# Patient Record
Sex: Female | Born: 1965 | Race: White | Hispanic: No | Marital: Married | Smoking: Current every day smoker
Health system: Southern US, Community
[De-identification: ages and names within clinical notes are randomized; demographics above are authoritative.]

## PROBLEM LIST (undated history)

## (undated) DIAGNOSIS — E785 Hyperlipidemia, unspecified: Secondary | ICD-10-CM

## (undated) DIAGNOSIS — I10 Essential (primary) hypertension: Secondary | ICD-10-CM

## (undated) HISTORY — PX: COLONOSCOPY: SHX174

## (undated) HISTORY — PX: BREAST EXCISIONAL BIOPSY: SUR124

## (undated) HISTORY — PX: HEMORRHOID SURGERY: SHX153

## (undated) HISTORY — DX: Hyperlipidemia, unspecified: E78.5

## (undated) HISTORY — PX: BREAST SURGERY: SHX581

## (undated) HISTORY — DX: Essential (primary) hypertension: I10

---

## 1991-10-18 HISTORY — PX: TUBAL LIGATION: SHX77

## 2000-01-27 ENCOUNTER — Other Ambulatory Visit: Admission: RE | Admit: 2000-01-27 | Discharge: 2000-01-27 | Payer: Self-pay | Admitting: Obstetrics & Gynecology

## 2001-01-11 ENCOUNTER — Other Ambulatory Visit: Admission: RE | Admit: 2001-01-11 | Discharge: 2001-01-11 | Payer: Self-pay | Admitting: Obstetrics & Gynecology

## 2001-10-15 ENCOUNTER — Ambulatory Visit (HOSPITAL_COMMUNITY): Admission: RE | Admit: 2001-10-15 | Discharge: 2001-10-15 | Payer: Self-pay | Admitting: Surgery

## 2001-10-15 ENCOUNTER — Encounter (INDEPENDENT_AMBULATORY_CARE_PROVIDER_SITE_OTHER): Payer: Self-pay

## 2002-03-19 ENCOUNTER — Ambulatory Visit (HOSPITAL_BASED_OUTPATIENT_CLINIC_OR_DEPARTMENT_OTHER): Admission: RE | Admit: 2002-03-19 | Discharge: 2002-03-19 | Payer: Self-pay | Admitting: Surgery

## 2002-03-19 ENCOUNTER — Encounter (INDEPENDENT_AMBULATORY_CARE_PROVIDER_SITE_OTHER): Payer: Self-pay | Admitting: Specialist

## 2002-04-11 ENCOUNTER — Encounter: Payer: Self-pay | Admitting: Surgery

## 2002-04-11 ENCOUNTER — Encounter: Admission: RE | Admit: 2002-04-11 | Discharge: 2002-04-11 | Payer: Self-pay | Admitting: Surgery

## 2004-06-14 ENCOUNTER — Ambulatory Visit (HOSPITAL_COMMUNITY): Admission: RE | Admit: 2004-06-14 | Discharge: 2004-06-14 | Payer: Self-pay | Admitting: Surgery

## 2004-10-25 ENCOUNTER — Emergency Department (HOSPITAL_COMMUNITY): Admission: EM | Admit: 2004-10-25 | Discharge: 2004-10-25 | Payer: Self-pay | Admitting: Family Medicine

## 2005-06-15 ENCOUNTER — Other Ambulatory Visit: Admission: RE | Admit: 2005-06-15 | Discharge: 2005-06-15 | Payer: Self-pay | Admitting: Obstetrics & Gynecology

## 2008-02-28 ENCOUNTER — Ambulatory Visit (HOSPITAL_COMMUNITY): Admission: RE | Admit: 2008-02-28 | Discharge: 2008-02-28 | Payer: Self-pay | Admitting: Obstetrics & Gynecology

## 2009-04-01 ENCOUNTER — Ambulatory Visit (HOSPITAL_COMMUNITY): Admission: RE | Admit: 2009-04-01 | Discharge: 2009-04-01 | Payer: Self-pay | Admitting: Obstetrics & Gynecology

## 2009-06-09 ENCOUNTER — Encounter: Admission: RE | Admit: 2009-06-09 | Discharge: 2009-06-09 | Payer: Self-pay | Admitting: Obstetrics & Gynecology

## 2010-11-01 ENCOUNTER — Encounter (INDEPENDENT_AMBULATORY_CARE_PROVIDER_SITE_OTHER): Payer: Self-pay | Admitting: *Deleted

## 2010-11-18 NOTE — Letter (Signed)
Summary: Pre Visit Letter Revised  [facility]  [address] [place], [place] [address]   Phone: [phone]  Fax: [phone]        11/01/2010 MRN: [id number] Tiffany Hensley [address] [place], [place]  [address]             Procedure Date:  December 06, 2010   dir col- Dr Jarold Motto   Welcome to the Gastroenterology Division at [facility].    You are scheduled to see a nurse for your pre-procedure visit on November 22, 2010 at 8:30am on the 3rd floor at [facility], [address]  We ask that you try to arrive at our office 15 minutes prior to your appointment time to allow for check-in.  Please take a minute to review the attached form.  If you answer "Yes" to one or more of the questions on the first page, we ask that you call the person listed at your earliest opportunity.  If you answer "No" to all of the questions, please complete the rest of the form and bring it to your appointment.    Your nurse visit will consist of discussing your medical and surgical history, your immediate family medical history, and your medications.   If you are unable to list all of your medications on the form, please bring the medication bottles to your appointment and we will list them.  We will need to be aware of both prescribed and over the counter drugs.  We will need to know exact dosage information as well.    Please be prepared to read and sign documents such as consent forms, a financial agreement, and acknowledgement forms.  If necessary, and with your consent, a friend or relative is welcome to sit-in on the nurse visit with you.  Please bring your insurance card so that we may make a copy of it.  If your insurance requires a referral to see a specialist, please bring your referral form from your primary care physician.  No co-pay is required for this nurse visit.     If you cannot keep your appointment, please call [phone] to cancel or reschedule  prior to your appointment date.  This allows [place] the opportunity to schedule an appointment for another patient in need of care.    Thank you for choosing [facility] for your medical needs.  We appreciate the opportunity to care for you.  Please visit [place] at our website  to learn more about our practice.  Sincerely, The Gastroenterology Division

## 2010-11-19 ENCOUNTER — Encounter (INDEPENDENT_AMBULATORY_CARE_PROVIDER_SITE_OTHER): Payer: Self-pay | Admitting: *Deleted

## 2010-11-22 ENCOUNTER — Encounter: Payer: Self-pay | Admitting: Gastroenterology

## 2010-12-02 NOTE — Letter (Signed)
Summary: [facility] Instructions  [facility]  [address] [place], [place] [address]   Phone: [phone]  Fax: [phone]       Tiffany Hensley    July 08, 1966    MRN: [id number]        Procedure Day Dorna Bloom:  Duanne Limerick  12/06/10     Arrival Time:  9:00AM     Procedure Time:  10:00AM     Location of Procedure:                    _ X_  [facility] (4th Floor)                      PREPARATION FOR COLONOSCOPY WITH MOVIPREP   Starting 5 days prior to your procedure 12/01/10 do not eat nuts, seeds, popcorn, corn, beans, peas,  salads, or any raw vegetables.  Do not take any fiber supplements (e.g. Metamucil, Citrucel, and Benefiber).  THE DAY BEFORE YOUR PROCEDURE         DATE: 12/05/10  DAY: SUNDAY  1.  Drink clear liquids the entire day-NO SOLID FOOD  2.  Do not drink anything colored red or purple.  Avoid juices with pulp.  No orange juice.  3.  Drink at least 64 oz. (8 glasses) of fluid/clear liquids during the day to prevent dehydration and help the prep work efficiently.  CLEAR LIQUIDS INCLUDE: Water Jello Ice Popsicles Tea (sugar ok, no milk/cream) Powdered fruit flavored drinks Coffee (sugar ok, no milk/cream) Gatorade Juice: apple, white grape, white cranberry  Lemonade Clear bullion, consomm, broth Carbonated beverages (any kind) Strained chicken noodle soup Hard Candy                             4.  In the morning, mix first dose of MoviPrep solution:    Empty 1 Pouch A and 1 Pouch B into the disposable container    Add lukewarm drinking water to the top line of the container. Mix to dissolve    Refrigerate (mixed solution should be used within 24 hrs)  5.  Begin drinking the prep at 5:00 p.m. The MoviPrep container is divided by 4 marks.   Every 15 minutes drink the solution down to the next mark (approximately 8 oz) until the full liter is complete.   6.  Follow completed prep with 16 oz of clear liquid of your choice (Nothing  red or purple).  Continue to drink clear liquids until bedtime.  7.  Before going to bed, mix second dose of MoviPrep solution:    Empty 1 Pouch A and 1 Pouch B into the disposable container    Add lukewarm drinking water to the top line of the container. Mix to dissolve    Refrigerate  THE DAY OF YOUR PROCEDURE      DATE: 12/06/10  DAY: MONDAY  Beginning at 5:00AM (5 hours before procedure):         1. Every 15 minutes, drink the solution down to the next mark (approx 8 oz) until the full liter is complete.  2. Follow completed prep with 16 oz. of clear liquid of your choice.    3. You may drink clear liquids until 8:00AM (2 HOURS BEFORE PROCEDURE).   MEDICATION INSTRUCTIONS  Unless otherwise instructed, you should take regular prescription medications with a small sip of water   as early as possible the morning of your  procedure.        OTHER INSTRUCTIONS  You will need a responsible adult at least 47 years of age to accompany you and drive you home.   This person must remain in the waiting room during your procedure.  Wear loose fitting clothing that is easily removed.  Leave jewelry and other valuables at home.  However, you may wish to bring a book to read or  an iPod/MP3 player to listen to music as you wait for your procedure to start.  Remove all body piercing jewelry and leave at home.  Total time from sign-in until discharge is approximately 2-3 hours.  You should go home directly after your procedure and rest.  You can resume normal activities the  day after your procedure.  The day of your procedure you should not:   Drive   Make legal decisions   Operate machinery   Drink alcohol   Return to work  You will receive specific instructions about eating, activities and medications before you leave.    The above instructions have been reviewed and explained to me by   Wyona Almas RN  November 22, 2010 8:57 AM     I fully understand and can  verbalize these instructions _____________________________ Date _________

## 2010-12-02 NOTE — Miscellaneous (Signed)
Summary: [facility] PREVISIT/PREP  Clinical Lists Changes  Medications: Added new medication of MOVIPREP 100 GM  SOLR (PEG-KCL-NACL-NASULF-NA ASC-C) As per prep instructions. - Signed Rx of MOVIPREP 100 GM  SOLR (PEG-KCL-NACL-NASULF-NA ASC-C) As per prep instructions.;  #1 x 0;  Signed;  Entered by: Wyona Almas RN;  Authorized by: Mardella Layman MD [facility];  Method used: Electronically to [facility] [address], [place], [place], [place]  [address], Ph: [phone] or [phone], Fax: [phone] Observations: Added new observation of NKA: T (11/22/2010 8:32)    Prescriptions: MOVIPREP 100 GM  SOLR (PEG-KCL-NACL-NASULF-NA ASC-C) As per prep instructions.  #1 x 0   Entered by:   Wyona Almas RN   Authorized by:   Mardella Layman MD [facility]   Signed by:   Wyona Almas RN on 11/22/2010   Method used:   Electronically to        [facility] [address] [phone]* (retail)       [address]       [place], [place]  [address]       Ph: [phone] or [phone]       Fax: [phone]   RxID:   [id number]

## 2010-12-06 ENCOUNTER — Other Ambulatory Visit (AMBULATORY_SURGERY_CENTER): Payer: BC Managed Care – PPO | Admitting: Gastroenterology

## 2010-12-06 ENCOUNTER — Encounter: Payer: Self-pay | Admitting: Gastroenterology

## 2010-12-06 DIAGNOSIS — K5730 Diverticulosis of large intestine without perforation or abscess without bleeding: Secondary | ICD-10-CM

## 2010-12-06 DIAGNOSIS — Z800 Family history of malignant neoplasm of digestive organs: Secondary | ICD-10-CM

## 2010-12-06 DIAGNOSIS — Z1211 Encounter for screening for malignant neoplasm of colon: Secondary | ICD-10-CM

## 2010-12-14 NOTE — Procedures (Signed)
Summary: Colonoscopy  Patient: Tiffany Hensley Note: All result statuses are Final unless otherwise noted.  Tests: (1) Colonoscopy (COL)   COL Colonoscopy           DONE     [facility]     [address]     [place], [place]  [address]           COLONOSCOPY PROCEDURE REPORT           PATIENT:  Tiffany Hensley, Tiffany Hensley  MR#:  [id number]     BIRTHDATE:  07/28/1966, 44 yrs. old  GENDER:  female     ENDOSCOPIST:  Vania Rea. Jarold Motto, MD, [facility]     REF. BY:     PROCEDURE DATE:  12/06/2010     PROCEDURE:  Higher-risk screening colonoscopy G0105           ASA CLASS:  Class I     INDICATIONS:  family history of colon cancer     MEDICATIONS:   Fentanyl 100 mcg IV, Versed 10 mg IV, Benadryl 12.5     mg IV           DESCRIPTION OF PROCEDURE:   After the risks benefits and     alternatives of the procedure were thoroughly explained, informed     consent was obtained.  Digital rectal exam was performed and     revealed no abnormalities.   The Pentax Ped Colon [id number]     endoscope was introduced through the anus and advanced to the     cecum, which was identified by both the appendix and ileocecal     valve, without limitations.  The quality of the prep was     excellent, using MoviPrep.  The instrument was then slowly     withdrawn as the colon was fully examined.     <<PROCEDUREIMAGES>>           FINDINGS:  Mild diverticulosis was found in the sigmoid to     descending colon segments.   Retroflexed views in the rectum     revealed no abnormalities.    The scope was then withdrawn from     the patient and the procedure completed.           COMPLICATIONS:  None     ENDOSCOPIC IMPRESSION:     1) Mild diverticulosis in the sigmoid to descending colon     segments     2) No polyps or cancers     RECOMMENDATIONS:     1) high fiber diet     2) Given your significant family history of colon cancer, you     should have a repeat colonoscopy in 5 years     REPEAT EXAM:  No        ______________________________     Vania Rea. Jarold Motto, MD, [facility]           CC:           n.     eSIGNED:   Vania Rea. Kijuana Ruppel at 12/06/2010 10:18 AM           Rust, Annabelle Harman, [id number]  Note: An exclamation mark (!) indicates a result that was not dispersed into the flowsheet. Document Creation Date: 12/06/2010 10:18 AM _______________________________________________________________________  (1) Order result status: Final Collection or observation date-time: 12/06/2010 10:11 Requested date-time:  Receipt date-time:  Reported date-time:  Referring Physician:   Ordering Physician: Sheryn Bison [phone]) Specimen Source:  Source: Launa Grill Order Number: [id number] Lab  site:   Appended Document: Colonoscopy    Clinical Lists Changes  Observations: Added new observation of COLONNXTDUE: 11/2015 (12/06/2010 11:04)

## 2011-03-04 NOTE — Op Note (Signed)
[place]  Patient:    Tiffany Hensley, Tiffany Hensley Visit Number: [id number] MRN: [id number]          Service Type: DSU Location: [facility] Attending Physician:  Katha Cabal Dictated by:   Thornton Park Daphine Deutscher, M.D. Proc. Date: 03/19/02 Admit Date:  03/19/2002                             Operative Report  CCS# [id number]  PREOPERATIVE DIAGNOSIS:  Increasing mass, right cheek, recurrent drainage from right breast.  POSTOPERATIVE DIAGNOSIS:  Right sebaceous cyst, status post excision and excision of granulation tissue, right breath.  OPERATION PERFORMED:  SURGEON:  Matthew B. Daphine Deutscher, M.D.  ANESTHESIA:  MAC.  DESCRIPTION OF PROCEDURE:  The patient is a 45 year old female who is brought to operating room 3 after marking these two little lesions.  An ellipse was described over this punctum and a fairly prominent mass on the right cheek. This area was infiltrated with 1% lidocaine plain and the little ellipse was excised and easily producing a prominent sebaceous cyst beneath it.  I dissected this from the surrounding tissue and it came out in toto.  The wound was then closed with 5-0 Vicryl in the depths and subcuticularly.  This coapted the tissue completely and I then buttressed that closure with benzoin and two Steri-Strips.  Next, I went down to the breast where different instruments were used.  I excised an ellipse from the 3 oclock position down to about the 5:30 to 6 oclock position of the areola.  This was an area where she had had this continued intermittent drainage.  I went down and excised basically some granulation tissue.  I did not encounter any pus.  I undermined the areola a bit.  It did not get back into the nipple.  I did not find any pockets.  I excised the ellipse and the underlying tissue in one block.  This was then closed with 4-0 Vicryl subcutaneously and subcuticularly and with benzoin and Steri-Strips on the skin.  The  patient seemed to tolerate the procedure well. She will be given Keflex 500 mg b.i.d. for four or five days and Vicodin to take if needed for pain. Dictated by:   Thornton Park Daphine Deutscher, M.D. Attending Physician:  Katha Cabal DD:  03/20/03 TD:  03/20/02 Job: [id number]

## 2011-03-04 NOTE — Op Note (Signed)
[facility]  Patient:    CLARENCE, DUNSMORE Visit Number: [id number] MRN: [id number]          Service Type: DSU Location: DAY Attending Physician:  Katha Cabal Dictated by:   Thorn[place] Park Daphine Deutscher, M.D. Proc. Date: 10/15/01 Admit Date:  10/15/2001   CC:         Gerrit Friends. Aldona Bar, M.D.   Operative Report  PREOPERATIVE DIAGNOSIS:  Recurrent abscesses right nipple region.  HISTORY:  Woodrow Dulski is a 45 year old female, who has had some recurrent smoldering mastitis medially along the areolar border of the right nipple. She has been on antibiotics for several weeks and still is having persistent drainage.  Informed consent was obtained regarding excision and drainage.  SURGEON:  Thornton Park. Daphine Deutscher, M.D.  ANESTHESIA:  General by LMA.  PROCEDURE:  Right breast biopsy.  DESCRIPTION OF PROCEDURE:  Ms. Buxbaum was taken back to room 1 and given general by LMA.  The breast was prepped with Betadine and draped sterilely.  A little probe was passed into the tract through which just a tiny, pinheads work of purulence had been expressed.  I then created an incision to excise that chronic tract, and I removed about a 1.5 cm in diameter sphere of retroareolar breast tissue.  This was in the area of the previous infection. I did not cut across anything that was grossly draining.  I tried to carry this in medially as well as laterally beneath the areola to get out any potential source for this recurrent infection.  When completed, the bleeding was controlled with the electrocautery.  There did not appear to be any sinuses or fistulae tract remaining.  The wound was irrigated with saline and then with some Marcaine and lidocaine and was approximated with 4-0 Vicryl subcutaneously.  Where I had actually excised a little tract, I left a little notch so that this could subsequently drain if needed.  Sterile dressing was applied.  The patient will be kept  on Keflex for the next two weeks and be followed up in the office. Dictated by:   Thornton Park Daphine Deutscher, M.D. Attending Physician:  Katha Cabal DD:  10/15/01 TD:  10/15/01 Job: [id number]

## 2016-01-20 ENCOUNTER — Encounter: Payer: Self-pay | Admitting: *Deleted

## 2016-02-11 ENCOUNTER — Encounter: Payer: Self-pay | Admitting: Gastroenterology

## 2019-09-20 ENCOUNTER — Telehealth (HOSPITAL_COMMUNITY): Payer: Self-pay | Admitting: *Deleted

## 2019-09-20 ENCOUNTER — Other Ambulatory Visit (HOSPITAL_COMMUNITY): Payer: Self-pay | Admitting: *Deleted

## 2019-09-20 DIAGNOSIS — N644 Mastodynia: Secondary | ICD-10-CM

## 2019-09-20 NOTE — Telephone Encounter (Signed)
Attempted to call patient to get clarification on breast symptoms. No one answered phone. Left voicemail for patient to call me back.

## 2019-09-20 NOTE — Telephone Encounter (Signed)
Patient returned my phone call and stated the breast surgery was around 30 years ago. Patient complained of left breast pain, swelling, and redness. Patient is scheduled to come to [facility] 10/03/2019 at 1015. Told patient is symptoms get worse or shows signs of infection to call me and we can work her in. Patient verbalized understanding.

## 2019-10-03 ENCOUNTER — Other Ambulatory Visit: Payer: Self-pay

## 2019-10-03 ENCOUNTER — Encounter (HOSPITAL_COMMUNITY): Payer: Self-pay

## 2019-10-03 ENCOUNTER — Ambulatory Visit (HOSPITAL_COMMUNITY)
Admission: RE | Admit: 2019-10-03 | Discharge: 2019-10-03 | Disposition: A | Discharge status: Home / Self Care | Payer: Self-pay | Source: Ambulatory Visit | Attending: Obstetrics and Gynecology | Admitting: Obstetrics and Gynecology

## 2019-10-03 ENCOUNTER — Ambulatory Visit: Payer: Self-pay

## 2019-10-03 ENCOUNTER — Ambulatory Visit
Admission: RE | Admit: 2019-10-03 | Discharge: 2019-10-03 | Disposition: A | Discharge status: Home / Self Care | Payer: No Typology Code available for payment source | Source: Ambulatory Visit | Attending: Obstetrics and Gynecology | Admitting: Obstetrics and Gynecology

## 2019-10-03 DIAGNOSIS — Z01419 Encounter for gynecological examination (general) (routine) without abnormal findings: Secondary | ICD-10-CM | POA: Insufficient documentation

## 2019-10-03 DIAGNOSIS — N644 Mastodynia: Secondary | ICD-10-CM

## 2019-10-03 HISTORY — DX: Mastodynia: N64.4

## 2019-10-03 NOTE — Patient Instructions (Signed)
Explained breast self awareness with Duaine Dredge. Let patient know [facility] will cover Pap smears and HPV typing every 5 years unless has a history of abnormal Pap smears. Referred patient to the [facility] for a screening mammogram. Appointment scheduled for Thursday, October 03, 2019 at 1310. Discussed smoking cessation with patient. Referred to the [facility] Quitline and gave resources to the free smoking cessation classes at [facility]. Patient aware of appointment and will be there. Let patient know will follow up with her within the next couple weeks with results of Pap smear by letter or phone. Newborn verbalized understanding.  Talise Sligh, Arvil Chaco, RN 12:38 PM

## 2019-10-03 NOTE — Progress Notes (Signed)
Complaints of left nipple area pain x 3-4 weeks that comes and goes. Patient rates the pain at a 2 out of 10.  Pap Smear: Pap smear completed today. Last Pap smear was over 5 years ago and normal per patient. Per patient has no history of an abnormal Pap smear. No Pap smear results are in Epic.  Physical exam: Breasts Breasts symmetrical. No skin abnormalities bilateral breasts. No nipple retraction bilateral breasts. No nipple discharge bilateral breasts. No lymphadenopathy. No lumps palpated bilateral breasts. No complaints of pain or tenderness on exam. Referred patient to the [facility] for a screening mammogram. Appointment scheduled for Thursday, October 03, 2019 at 1310.        Pelvic/Bimanual   Ext Genitalia No lesions, no swelling and no discharge observed on external genitalia.         Vagina Vagina pink and normal texture. No lesions or discharge observed in vagina.          Cervix Cervix is present. Cervix pink and of normal texture. No discharge observed.     Uterus Uterus is present and palpable. Uterus in normal position and normal size.        Adnexae Bilateral ovaries present and palpable. No tenderness on palpation.         Rectovaginal No rectal exam completed today since patient had no rectal complaints. No skin abnormalities observed on exam.    Smoking History: Patient is a current smoker. Discussed smoking cessation with patient. Referred to the [facility] Quitline and gave resources to the free smoking cessation classes at [facility].  Patient Navigation: Patient education provided. Access to services provided for patient through [facility] program.   Colorectal Cancer Screening: Per patient had a colonoscopy completed in 2015. No complaints today.   Breast and Cervical Cancer Risk Assessment: Patient has no family history of breast cancer, known genetic mutations, or radiation treatment to the chest before age 24. Patient has no history of cervical  dysplasia, immunocompromised, or DES exposure in-utero.  Risk Assessment    Risk Scores      10/03/2019   Last edited by: Loletta Parish, RN   5-year risk: 1.2 %   Lifetime risk: 8.3 %

## 2019-10-08 LAB — CYTOLOGY - PAP
Comment: NEGATIVE
Diagnosis: UNDETERMINED — AB
High risk HPV: NEGATIVE

## 2019-10-09 ENCOUNTER — Other Ambulatory Visit (HOSPITAL_COMMUNITY): Payer: Self-pay | Admitting: *Deleted

## 2019-10-09 ENCOUNTER — Telehealth (HOSPITAL_COMMUNITY): Payer: Self-pay | Admitting: *Deleted

## 2019-10-09 MED ORDER — METRONIDAZOLE 500 MG PO TABS: 500.0000 mg | ORAL_TABLET | Freq: Two times a day (BID) | ORAL | 0 refills | Status: DC

## 2019-10-09 NOTE — Telephone Encounter (Signed)
Patient returned my phone call. Explained to patient that her Pap smear showed abnormal cells and the HPV was negative. Informed patient that a Pap smear is recommended in one year for follow-up. Patients Pap smear showed Trichomonas. Explained Trichomonas to patient and that her partner would need to get treated. Patient stated she has not been sexually active in years. Told patient that an antibiotic prescription for Flagyl has been sent to her pharmacy. Explained to patient that the prescription will be taken BID x 7 days and to avoid alcohol while taking. Verified patients pharmacy. Patient verbalized understanding.

## 2019-10-09 NOTE — Telephone Encounter (Signed)
Attempted to call patient on both home and mobile number to discuss Pap smear results. No one answered either phone. Left voicemail on both numbers for patient to call me back.

## 2019-11-06 ENCOUNTER — Other Ambulatory Visit: Payer: Self-pay | Admitting: Obstetrics and Gynecology

## 2019-11-06 ENCOUNTER — Other Ambulatory Visit: Payer: Self-pay

## 2019-11-06 ENCOUNTER — Inpatient Hospital Stay: Payer: Self-pay | Attending: Obstetrics and Gynecology | Admitting: *Deleted

## 2019-11-06 VITALS — BP 170/98 | Temp 97.1°F | Ht 62.0 in | Wt 98.0 lb

## 2019-11-06 DIAGNOSIS — Z0000 Encounter for general adult medical examination without abnormal findings: Secondary | ICD-10-CM

## 2019-11-06 NOTE — Progress Notes (Signed)
[facility] initial screening     Clinical Measurement:  Height: 62 in Weight: 98 lb  Blood Pressure: 165/105  Blood Pressure #2: 170/98 Fasting Labs Drawn Today, will review with patient when they result.   Medical History:  Patient states that she does not have a history of  high cholesterol, high blood pressure and diabetes.  Medications:  Patient states that she does not take medication to lower cholesterol, blood pressure and blood sugar. Patient does not take an aspirin a day to help prevent a heart attack or stroke.    Blood pressure, self measurement: Patient states that she does measure blood pressure from home daily. Patient does not share blood pressure readings with a health care provider.   Nutrition: Patient states that on average she eats 0 cups of fruit and 2 cups of vegetables per day. Patient states that she does not eat fish at least 2 times per week. Patient eats less than half servings of whole grains. Patient does not drink less than 36 ounces of beverages with added sugar weekly. Patient is not currently watching sodium or salt intake. In the past 7 days patient has had drinks containing alcohol on 1 day. On an average day that patient consumes alcohol patient will consume 2 drinks containing alcohol.      Physical activity:  Patient states that she gets 0 minutes of moderate and 0 of vigorous physical activity each week.  Smoking status:  Patient states that she is a current tobacco user. Offered to refer patient to the [facility] Quitline or one of the [facility] classes but patient declined at this time.   Quality of life:  Over the past 2 weeks patient states that she has not had any days where she has little interest or pleasure in doing things and 0 days where she has felt down, depressed or hopeless.    Risk reduction and counseling:     Health Coaching: Encouraged patient to add more fruits and vegetables into daily diet. Explained that the recommendation is for 2 cups of  fruit and 3 cups of vegetables daily. Also encouraged patient to add more heart healthy fish and whole grains into diet. Encouraged patient to also start watching her sodium/salt intake since her BP has been elevated recently. Also spoke with patient about trying to add in daily exercise to help lower blood pressure. Will refer patient to Internal Medicine for follow-up for elevated BP. Patient has been checking BP at home daily. Told patient to continue logging BP readings to share with the doctor at her follow-up visit.   Navigation:  I will notify patient of lab results. Patient is aware of 2 more health coaching sessions and a follow up. Will call patient with follow-up appointment information for Internal Medicine once appointment is scheduled.  Time: 20 minutes

## 2019-11-07 LAB — LIPID PANEL W/O CHOL/HDL RATIO
Cholesterol, Total: 226 mg/dL — ABNORMAL HIGH (ref 100–199)
HDL: 98 mg/dL (ref >39)
LDL Chol Calc (NIH): 117 mg/dL — ABNORMAL HIGH (ref 0–99)
Triglycerides: 64 mg/dL (ref 0–149)
VLDL Cholesterol Cal: 11 mg/dL (ref 5–40)

## 2019-11-07 LAB — GLUCOSE, RANDOM: Glucose: 93 mg/dL (ref 65–99)

## 2019-11-07 LAB — HGB A1C W/O EAG: Hgb A1c MFr Bld: 5 % (ref 4.8–5.6)

## 2019-11-12 ENCOUNTER — Encounter (HOSPITAL_COMMUNITY): Payer: Self-pay

## 2019-11-12 ENCOUNTER — Telehealth (HOSPITAL_COMMUNITY): Payer: Self-pay

## 2019-11-12 NOTE — Telephone Encounter (Signed)
Health coaching 2      Labs- 226 cholesterol, 117 LDL cholesterol, 64 triglycerides, 98 HDL cholesterol, 5.0 hemoglobin A1C, 93 mean plasma glucose   Patient understands and is aware of her lab results.   Goals-  Discussed lab results with patient and answered any questions patient had regarding results. Decrease the amount of fried and fatty foods consumed. Reduce the amount of red meat consumed. Add in healthy fats into diets like olive oil, avocados or nuts. Add in more whole grains into diet. Add in heart healthy fish into diet. Increase daily exercise to 20 minutes.   Navigation:  Patient is aware of 1 more health coaching sessions and a follow up. Patient is scheduled for follow-up with Internal Medicine on   Time- 10 minutes

## 2019-11-28 ENCOUNTER — Encounter: Payer: Self-pay | Admitting: Internal Medicine

## 2019-11-28 ENCOUNTER — Other Ambulatory Visit: Payer: Self-pay

## 2019-11-28 ENCOUNTER — Ambulatory Visit (INDEPENDENT_AMBULATORY_CARE_PROVIDER_SITE_OTHER): Payer: Self-pay | Admitting: Internal Medicine

## 2019-11-28 VITALS — BP 174/100 | HR 84 | Temp 98.4°F | Wt 96.8 lb

## 2019-11-28 DIAGNOSIS — Z0000 Encounter for general adult medical examination without abnormal findings: Secondary | ICD-10-CM

## 2019-11-28 DIAGNOSIS — Z681 Body mass index (BMI) 19 or less, adult: Secondary | ICD-10-CM

## 2019-11-28 DIAGNOSIS — I10 Essential (primary) hypertension: Secondary | ICD-10-CM

## 2019-11-28 DIAGNOSIS — Z79899 Other long term (current) drug therapy: Secondary | ICD-10-CM

## 2019-11-28 DIAGNOSIS — E785 Hyperlipidemia, unspecified: Secondary | ICD-10-CM

## 2019-11-28 DIAGNOSIS — Z720 Tobacco use: Secondary | ICD-10-CM

## 2019-11-28 DIAGNOSIS — R634 Abnormal weight loss: Secondary | ICD-10-CM | POA: Insufficient documentation

## 2019-11-28 MED ORDER — HYDROCHLOROTHIAZIDE 12.5 MG PO CAPS: 12.5000 mg | ORAL_CAPSULE | Freq: Every day | ORAL | 11 refills | Status: DC

## 2019-11-28 MED ORDER — PRAVASTATIN SODIUM 40 MG PO TABS: 40.0000 mg | ORAL_TABLET | Freq: Every day | ORAL | 3 refills | Status: DC

## 2019-11-28 NOTE — Patient Instructions (Signed)
Ms. Becvar,  Thanks for seeing Tiffany Hensley today. Here are my recommendations today  1)Start Hydrochlorothiazide for blood pressure 2)Start pravastatin for high cholesterol  3)Please follow up with the financial counselor   Take care! Dr. Dortha Schwalbe  Please call the internal medicine center clinic if you have any questions or concerns, we may be able to help and keep you from a long and expensive emergency room wait. Our clinic and after hours phone number is [phone], the best time to call is Monday through Friday 9 am to 4 pm but there is always someone available 24/7 if you have an emergency. If you need medication refills please notify your pharmacy one week in advance and they will send Tiffany Hensley a request.

## 2019-11-28 NOTE — Assessment & Plan Note (Signed)
Weight loss: She states that for the past 1 to 2 years she has had about 10 pound weight loss.  She states that she comes from a family of thin individuals and usually her weight averages 108 pounds 112 pounds.  She states that she used to drink on a daily basis but quit drinking about 3 years ago and since that she has noticed her weight loss.  She denies fevers or chills but does report "hot flashes and night sweats.  "This happens intermittently and sometimes she gets cold and hot.  She does have a significant smoking history and states that she started smoking at 54 years old and will smoke pieces of cigarettes daily.  She has never smoked up to a pack a day.  Currently, she smokes 2 to 3 pieces of cigarettes a day.  She denies hematochezia, bright red blood per rectum, cough or any other constitutional symptoms.  She did have a colonoscopy in 2012 as she states that her father passed from pancreatic cancer in her entire family got colonoscopies.  The colonoscopy revealed mild diverticulosis of the sigmoid to descending colon without polyps or cancers.  Mammogram performed on 10/03/2019 was unremarkable, BI-RADS 1  Plan: -We will obtain low-dose chest CT once financial assistance is obtained -We will refer for screening colonoscopy once financial assistance is obtained

## 2019-11-28 NOTE — Assessment & Plan Note (Signed)
Hypertension: Newly diagnosed.  Her Home BP readings have ranged in the 130s-160s/60s-90s.  BP Readings from Last 3 Encounters:  11/28/19 (!) 174/100  11/06/19 (!) 170/98  10/03/19 (!) 160/84   Plan: -Start hydrochlorothiazide 12.5 mg daily -Follow BMP

## 2019-11-28 NOTE — Assessment & Plan Note (Signed)
Hyperlipidemia: ASCVD of 7.8%  Plan: -Start pravastatin 40 mg daily

## 2019-11-28 NOTE — Progress Notes (Signed)
   CC: Follow-up hypertension, hyperlipidemia  HPI:  [name] is a 54 y.o. with no known past medical history presenting to establish care and also for evaluation of oriented his blood pressures.  She states that she does not really follow-up with physician unless she has to.  Several weeks ago, she noticed that her blood pressures were elevated at home and was seen at [facility].  Please see problem based charting for further details.  Past Medical History:  Diagnosis Date  . Breast pain, left 10/03/2019   Past medical history: None Surgical history: Per surgery Hospitalizations: None Allergies: None Medications: None Family history: Father deceased with pancreatic cancer Occupation: Has been working at [employer] for 15 years Social history: Was born at [employer], married, passed the daughter and a granddaughter.  Smokes 2 to 3 cigarettes/day.  Has been smoking since 54 years of age.  Has never smoked up to a pack a day.  Quit drinking alcohol 3 years ago   Review of Systems:  Asper HPI  Physical Exam:  Vitals:   11/28/19 1404  BP: (!) 174/100  Pulse: 84  Temp: 98.4 F (36.9 C)  TempSrc: Oral  SpO2: 97%  Weight: 96 lb 12.8 oz (43.9 kg)   Physical Exam  Constitutional: She is well-developed, well-nourished, and in no distress. No distress.  HENT:  Head: Normocephalic and atraumatic.  Cardiovascular: Normal rate, regular rhythm and normal heart sounds. Exam reveals no friction rub.  No murmur heard. Pulmonary/Chest: Effort normal and breath sounds normal. No respiratory distress. She has no wheezes. She has no rales.  Musculoskeletal:        General: No deformity or edema. Normal range of motion.     Cervical back: Neck supple.  Neurological: She is alert. Gait normal.  Skin: Skin is warm. She is not diaphoretic.  Psychiatric: Mood and affect normal.    Assessment & Plan:   See Encounters Tab for problem based  charting.  Patient discussed with Dr. Sandre Kitty

## 2019-11-29 LAB — BMP8+ANION GAP
Anion Gap: 17 mmol/L (ref 10.0–18.0)
BUN/Creatinine Ratio: 13 (ref 9–23)
BUN: 8 mg/dL (ref 6–24)
CO2: 24 mmol/L (ref 20–29)
Calcium: 9.9 mg/dL (ref 8.7–10.2)
Chloride: 99 mmol/L (ref 96–106)
Creatinine, Ser: 0.64 mg/dL (ref 0.57–1.00)
GFR calc Af Amer: 116 mL/min/{1.73_m2} (ref >59)
GFR calc non Af Amer: 101 mL/min/{1.73_m2} (ref >59)
Glucose: 72 mg/dL (ref 65–99)
Potassium: 3.5 mmol/L (ref 3.5–5.2)
Sodium: 140 mmol/L (ref 134–144)

## 2019-11-29 LAB — CBC
Hematocrit: 41.1 % (ref 34.0–46.6)
Hemoglobin: 14.3 g/dL (ref 11.1–15.9)
MCH: 33.7 pg — ABNORMAL HIGH (ref 26.6–33.0)
MCHC: 34.8 g/dL (ref 31.5–35.7)
MCV: 97 fL (ref 79–97)
Platelets: 249 10*3/uL (ref 150–450)
RBC: 4.24 x10E6/uL (ref 3.77–5.28)
RDW: 12.5 % (ref 11.7–15.4)
WBC: 7.2 10*3/uL (ref 3.4–10.8)

## 2019-12-02 ENCOUNTER — Encounter: Payer: Self-pay | Admitting: Internal Medicine

## 2019-12-02 NOTE — Progress Notes (Signed)
Internal Medicine Clinic Attending  Case discussed with Dr. Agyei at the time of the visit.  We reviewed the resident's history and exam and pertinent patient test results.  I agree with the assessment, diagnosis, and plan of care documented in the resident's note.  Kirti Carl, M.D., Ph.D.  

## 2019-12-16 ENCOUNTER — Ambulatory Visit: Payer: No Typology Code available for payment source

## 2019-12-18 ENCOUNTER — Other Ambulatory Visit: Payer: Self-pay

## 2019-12-18 DIAGNOSIS — N644 Mastodynia: Secondary | ICD-10-CM

## 2019-12-19 ENCOUNTER — Ambulatory Visit: Payer: No Typology Code available for payment source | Attending: Family

## 2019-12-19 DIAGNOSIS — Z23 Encounter for immunization: Secondary | ICD-10-CM | POA: Insufficient documentation

## 2019-12-19 NOTE — Progress Notes (Signed)
   Covid-19 Vaccination Clinic  Name:  Tiffany Hensley    MRN: [id number] DOB: Oct 20, 1965  12/19/2019  [name] was observed post Covid-19 immunization for 15 minutes without incident. She was provided with Vaccine Information Sheet and instruction to access the V-Safe system.   [name] was instructed to call 911 with any severe reactions post vaccine: Marland Kitchen Difficulty breathing  . Swelling of face and throat  . A fast heartbeat  . A bad rash all over body  . Dizziness and weakness   Immunizations Administered    Name Date Dose VIS Date Route   Moderna COVID-19 Vaccine 12/19/2019 12:25 PM 0.5 mL 09/17/2019 Intramuscular   Manufacturer: Moderna   Lot: [id number]   NDC: 88891-694-50

## 2019-12-24 ENCOUNTER — Ambulatory Visit: Payer: Self-pay | Admitting: Women's Health

## 2019-12-24 ENCOUNTER — Encounter: Payer: Self-pay | Admitting: Women's Health

## 2019-12-24 ENCOUNTER — Other Ambulatory Visit: Payer: Self-pay

## 2019-12-24 VITALS — BP 154/96 | Temp 98.4°F | Wt 94.0 lb

## 2019-12-24 DIAGNOSIS — Z1239 Encounter for other screening for malignant neoplasm of breast: Secondary | ICD-10-CM

## 2019-12-24 NOTE — Progress Notes (Addendum)
Tiffany Tiffany Hensley. Tiffany Hensley is a 54 y.o. female who presents to [facility] clinic today with complaint of left breast redness and pain x1 week in the nipple area. Pt denies any lump. Pt hs a history of a duct removal in the left breast about 10-15 years ago for clogged milk ducts.    Pap Smear: Pap not smear completed today. Last Pap smear was 10/03/2019 at [facility] clinic and was abnormal - ASCUS/HPV negative. Per patient has history of an abnormal Pap smear. Last Pap smear result is available in Epic.   Physical exam: Breasts Breasts symmetrical. No skin abnormalities bilateral breasts. No nipple retraction bilateral breasts. No nipple discharge bilateral breasts. No lymphadenopathy. No lumps palpated right breast. 2.5cmx2cm mass palpated beneath nipple/with in areola at 5 o'clock with redness, pain on palpation, firm, round, smooth, mobile. Pt reports she has had this occur in the past and it will resolve spontaneously. Pt reports this mass was larger and more painful but is resolving.      Pelvic/Bimanual Pap is not indicated today    Smoking History: Patient has never smoked not referred to quit line.    Patient Navigation: Patient education provided. Access to services provided for patient through [facility] program. No interpreter provided. No transportation provided   Colorectal Cancer Screening: Per patient has never had colonoscopy completed No complaints today.    Breast and Cervical Cancer Risk Assessment: Patient does not have family history of breast cancer, known genetic mutations, or radiation treatment to the chest before age 89. Patient does not have history of cervical dysplasia, immunocompromised, or DES exposure in-utero.  Risk Assessment    No risk assessment data      A: [facility] exam without pap smear Complaint of left breast redness and pain x1 week in the nipple area. Pt denies any lump. Pt hs a history of a duct removal in the left breast about 10-15 years ago for clogged milk  ducts.   P: Referred patient to the [facility] for a diagnostic mammogram. Appointment scheduled 12/26/2019. Pt advised to seek care with PCP to discuss blood pressure, warning signs of hypertensive emergency discussed and patient advised to seek care in ED if they occur. Pt reports she has a PCP and is on BP medication and will call to schedule appt for BP check. Pt also advised to take BP at home daily to bring to next appt.  Tiffany Tiffany Hensley, Odie Sera, NP 12/24/2019 1:19 PM

## 2019-12-24 NOTE — Patient Instructions (Signed)
Breast Self-Awareness Breast self-awareness means being familiar with how your breasts look and feel. It involves checking your breasts regularly and reporting any changes to your health care provider. Practicing breast self-awareness is important. Sometimes changes may not be harmful (are benign), but sometimes a change in your breasts can be a sign of a serious medical problem. It is important to learn how to do this procedure correctly so that you can catch problems early, when treatment is more likely to be successful. All women should practice breast self-awareness, including women who have had breast implants. What you need:  A mirror.  A well-lit room. How to do a breast self-exam A breast self-exam is one way to learn what is normal for your breasts and whether your breasts are changing. To do a breast self-exam: Look for changes  1. Remove all the clothing above your waist. 2. Stand in front of a mirror in a room with good lighting. 3. Put your hands on your hips. 4. Push your hands firmly downward. 5. Compare your breasts in the mirror. Look for differences between them (asymmetry), such as: ? Differences in shape. ? Differences in size. ? Puckers, dips, and bumps in one breast and not the other. 6. Look at each breast for changes in the skin, such as: ? Redness. ? Scaly areas. 7. Look for changes in your nipples, such as: ? Discharge. ? Bleeding. ? Dimpling. ? Redness. ? A change in position. Feel for changes Carefully feel your breasts for lumps and changes. It is best to do this while lying on your back on the floor, and again while sitting or standing in the tub or shower with soapy water on your skin. Feel each breast in the following way: 1. Place the arm on the side of the breast you are examining above your head. 2. Feel your breast with the other hand. 3. Start in the nipple area and make -inch (2 cm) overlapping circles to feel your breast. Use the pads of your  three middle fingers to do this. Apply light pressure, then medium pressure, then firm pressure. The light pressure will allow you to feel the tissue closest to the skin. The medium pressure will allow you to feel the tissue that is a little deeper. The firm pressure will allow you to feel the tissue close to the ribs. 4. Continue the overlapping circles, moving downward over the breast until you feel your ribs below your breast. 5. Move one finger-width toward the center of the body. Continue to use the -inch (2 cm) overlapping circles to feel your breast as you move slowly up toward your collarbone. 6. Continue the up-and-down exam using all three pressures until you reach your armpit.  Write down what you find Writing down what you find can help you remember what to discuss with your health care provider. Write down:  What is normal for each breast.  Any changes that you find in each breast, including: ? The kind of changes you find. ? Any pain or tenderness. ? Size and location of any lumps.  Where you are in your menstrual cycle, if you are still menstruating. General tips and recommendations  Examine your breasts every month.  If you are breastfeeding, the best time to examine your breasts is after a feeding or after using a breast pump.  If you menstruate, the best time to examine your breasts is 5-7 days after your period. Breasts are generally lumpier during menstrual periods, and it may   be more difficult to notice changes.  With time and practice, you will become more familiar with the variations in your breasts and more comfortable with the exam. Contact a health care provider if you:  See a change in the shape or size of your breasts or nipples.  See a change in the skin of your breast or nipples, such as a reddened or scaly area.  Have unusual discharge from your nipples.  Find a lump or thick area that was not there before.  Have pain in your breasts.  Have any  concerns related to your breast health. Summary  Breast self-awareness includes looking for physical changes in your breasts, as well as feeling for any changes within your breasts.  Breast self-awareness should be performed in front of a mirror in a well-lit room.  You should examine your breasts every month. If you menstruate, the best time to examine your breasts is 5-7 days after your menstrual period.  Let your health care provider know of any changes you notice in your breasts, including changes in size, changes on the skin, pain or tenderness, or unusual fluid from your nipples. This information is not intended to replace advice given to you by your health care provider. Make sure you discuss any questions you have with your health care provider. Document Revised: 05/22/2018 Document Reviewed: 05/22/2018 Elsevier Patient Education  2020 Elsevier Inc.  

## 2019-12-26 ENCOUNTER — Ambulatory Visit
Admission: RE | Admit: 2019-12-26 | Discharge: 2019-12-26 | Disposition: A | Discharge status: Home / Self Care | Payer: No Typology Code available for payment source | Source: Ambulatory Visit | Attending: Obstetrics and Gynecology | Admitting: Obstetrics and Gynecology

## 2019-12-26 ENCOUNTER — Other Ambulatory Visit: Payer: Self-pay

## 2019-12-26 ENCOUNTER — Other Ambulatory Visit: Payer: Self-pay | Admitting: Obstetrics and Gynecology

## 2019-12-26 DIAGNOSIS — N644 Mastodynia: Secondary | ICD-10-CM

## 2019-12-26 DIAGNOSIS — N6320 Unspecified lump in the left breast, unspecified quadrant: Secondary | ICD-10-CM

## 2020-01-01 ENCOUNTER — Ambulatory Visit: Payer: Self-pay

## 2020-01-01 ENCOUNTER — Other Ambulatory Visit: Payer: Self-pay

## 2020-01-21 ENCOUNTER — Ambulatory Visit: Payer: No Typology Code available for payment source | Attending: Family

## 2020-01-21 DIAGNOSIS — Z23 Encounter for immunization: Secondary | ICD-10-CM

## 2020-01-21 NOTE — Progress Notes (Signed)
   Covid-19 Vaccination Clinic  Name:  NINOSHKA WAINWRIGHT    MRN: [id number] DOB: [birth year]  01/21/2020  Ms. Suthers was observed post Covid-19 immunization for 15 minutes without incident. She was provided with Vaccine Information Sheet and instruction to access the V-Safe system.   Ms. Eaker was instructed to call 911 with any severe reactions post vaccine: Marland Kitchen Difficulty breathing  . Swelling of face and throat  . A fast heartbeat  . A bad rash all over body  . Dizziness and weakness   Immunizations Administered    Name Date Dose VIS Date Route   Moderna COVID-19 Vaccine 01/21/2020  9:09 AM 0.5 mL 09/17/2019 Intramuscular   Manufacturer: Moderna   Lot: [id number]   NDC: 78718-367-25

## 2020-01-23 ENCOUNTER — Ambulatory Visit
Admission: RE | Admit: 2020-01-23 | Discharge: 2020-01-23 | Disposition: A | Discharge status: Home / Self Care | Payer: No Typology Code available for payment source | Source: Ambulatory Visit | Attending: Obstetrics and Gynecology | Admitting: Obstetrics and Gynecology

## 2020-01-23 ENCOUNTER — Other Ambulatory Visit: Payer: Self-pay

## 2020-01-23 ENCOUNTER — Other Ambulatory Visit: Payer: Self-pay | Admitting: Obstetrics and Gynecology

## 2020-01-23 DIAGNOSIS — N6320 Unspecified lump in the left breast, unspecified quadrant: Secondary | ICD-10-CM

## 2020-01-27 ENCOUNTER — Ambulatory Visit: Payer: No Typology Code available for payment source

## 2020-02-06 ENCOUNTER — Ambulatory Visit
Admission: RE | Admit: 2020-02-06 | Discharge: 2020-02-06 | Disposition: A | Discharge status: Home / Self Care | Payer: No Typology Code available for payment source | Source: Ambulatory Visit | Attending: Obstetrics and Gynecology | Admitting: Obstetrics and Gynecology

## 2020-02-06 ENCOUNTER — Other Ambulatory Visit: Payer: Self-pay | Admitting: Obstetrics and Gynecology

## 2020-02-06 ENCOUNTER — Other Ambulatory Visit: Payer: Self-pay

## 2020-02-06 DIAGNOSIS — N6320 Unspecified lump in the left breast, unspecified quadrant: Secondary | ICD-10-CM

## 2020-02-20 ENCOUNTER — Other Ambulatory Visit: Payer: Self-pay | Admitting: Obstetrics and Gynecology

## 2020-02-20 ENCOUNTER — Other Ambulatory Visit: Payer: Self-pay

## 2020-02-20 ENCOUNTER — Ambulatory Visit
Admission: RE | Admit: 2020-02-20 | Discharge: 2020-02-20 | Disposition: A | Discharge status: Home / Self Care | Payer: No Typology Code available for payment source | Source: Ambulatory Visit | Attending: Obstetrics and Gynecology | Admitting: Obstetrics and Gynecology

## 2020-02-20 DIAGNOSIS — N611 Abscess of the breast and nipple: Secondary | ICD-10-CM

## 2020-02-20 DIAGNOSIS — N6320 Unspecified lump in the left breast, unspecified quadrant: Secondary | ICD-10-CM

## 2020-03-24 ENCOUNTER — Ambulatory Visit
Admission: RE | Admit: 2020-03-24 | Discharge: 2020-03-24 | Disposition: A | Discharge status: Home / Self Care | Payer: No Typology Code available for payment source | Source: Ambulatory Visit | Attending: Obstetrics and Gynecology | Admitting: Obstetrics and Gynecology

## 2020-03-24 ENCOUNTER — Other Ambulatory Visit: Payer: Self-pay

## 2020-03-24 DIAGNOSIS — N611 Abscess of the breast and nipple: Secondary | ICD-10-CM

## 2020-04-01 ENCOUNTER — Encounter: Payer: Self-pay | Admitting: Internal Medicine

## 2020-04-01 ENCOUNTER — Ambulatory Visit: Payer: Self-pay | Admitting: Internal Medicine

## 2020-04-01 ENCOUNTER — Other Ambulatory Visit: Payer: Self-pay

## 2020-04-01 VITALS — BP 157/90 | HR 79 | Temp 98.8°F | Ht 62.0 in | Wt 94.3 lb

## 2020-04-01 DIAGNOSIS — Z1159 Encounter for screening for other viral diseases: Secondary | ICD-10-CM

## 2020-04-01 DIAGNOSIS — Z114 Encounter for screening for human immunodeficiency virus [HIV]: Secondary | ICD-10-CM

## 2020-04-01 DIAGNOSIS — I10 Essential (primary) hypertension: Secondary | ICD-10-CM

## 2020-04-01 DIAGNOSIS — R634 Abnormal weight loss: Secondary | ICD-10-CM

## 2020-04-01 MED ORDER — LISINOPRIL-HYDROCHLOROTHIAZIDE 20-12.5 MG PO TABS: 1.0000 | ORAL_TABLET | Freq: Every day | ORAL | 11 refills | Status: DC

## 2020-04-01 NOTE — Patient Instructions (Addendum)
It was nice seeing you today! Thank you for choosing [facility] for your Primary Care.    Today we talked about:   1. High blood pressure: We changed your medication today to Lisinopril-HCTZ  2. Weight loss: We will continue working up and monitoring your weight. Today, I ordered some blood work and CT scan of your lungs

## 2020-04-01 NOTE — Assessment & Plan Note (Signed)
BP Readings from Last 3 Encounters:  04/01/20 (!) 157/90  12/24/19 (!) 154/96  11/28/19 (!) 174/100   Tiffany Hensley endorses continued high BP readings at home with average of 158/90s. She notices she has a headache when her blood pressure is at it's highest. She denies any dizziness, vision changes, chest pain, palpitations, leg swelling, SOB.   Assessment/Plan: Will need more aggressive management of BP. Plan to add on an ACE-I.   - Start Lisinopril-HCTZ 20-12.5 mg daily  - Check BMP at next visit - Discussed tobacco cessation - Discussed low salt diet

## 2020-04-01 NOTE — Progress Notes (Signed)
   CC: HTN follow up  HPI:  [name] is a 54 y.o. with a PMHx as listed below who presents to the clinic for HTN follow up.   Please see the Encounters tab for problem-based Assessment & Plan regarding status of patient's chronic conditions.  Past Medical History:  Diagnosis Date  . Breast pain, left 10/03/2019  . Hyperlipidemia   . Hypertension    Review of Systems: Review of Systems  Constitutional: Positive for malaise/fatigue and weight loss. Negative for chills and fever.       + night sweats, + hot flashes  Eyes: Negative for blurred vision and double vision.  Respiratory: Negative for cough, shortness of breath and wheezing.   Cardiovascular: Negative for chest pain, palpitations, orthopnea, claudication and leg swelling.  Gastrointestinal: Negative for abdominal pain, diarrhea, nausea and vomiting.  Musculoskeletal: Negative for joint pain and myalgias.  Skin: Negative for itching and rash.       + mole on left knee  Neurological: Positive for headaches. Negative for dizziness, focal weakness and weakness.    Physical Exam:  Vitals:   04/01/20 1321 04/01/20 1326 04/01/20 1327  BP: (!) 163/90 (!) 152/94 (!) 157/90  Pulse: 83 78 79  Temp: 98.8 F (37.1 C)    TempSrc: Oral    SpO2: 99%    Weight: 94 lb 4.8 oz (42.8 kg)    Height: 5\' 2"  (1.575 m)     Physical Exam Constitutional:      Appearance: She is underweight.  HENT:     Head: Normocephalic and atraumatic.  Cardiovascular:     Rate and Rhythm: Normal rate and regular rhythm.     Heart sounds: No murmur heard.  No gallop.      Comments: No carotid bruits Pulmonary:     Effort: Pulmonary effort is normal. No respiratory distress.     Breath sounds: Normal breath sounds. No wheezing, rhonchi or rales.  Abdominal:     General: Bowel sounds are normal. There is no distension.     Palpations: Abdomen is soft.     Tenderness: There is no abdominal tenderness.  Musculoskeletal:         General: No tenderness.     Right lower leg: No edema.     Left lower leg: No edema.  Skin:    General: Skin is warm and dry.     Comments: 2 mm papule on lateral left knee that is rough/scaly with some discoloration.   Neurological:     General: No focal deficit present.     Mental Status: She is alert and oriented to person, place, and time. Mental status is at baseline.  Psychiatric:        Mood and Affect: Mood normal.        Behavior: Behavior normal.    Assessment & Plan:   See Encounters Tab for problem based charting.  Patient discussed with Dr. 

## 2020-04-01 NOTE — Assessment & Plan Note (Signed)
Tiffany Hensley states her weight loss began approximately 2 years ago after her husband was diagnosed as diabetic and they both drastically changed their diet. At that time, she also quit drinking. However, she states she has always been around 110-112 pounds now despite a good appetite has been unable to break above 100.  She feels she eats a well-balanced diet with controlled 6 days/week.  She endorses night sweats that is difficult for her to discern from her normal hot flashes.  She says that she just wakes up drenched in sweat.  She denies any fever, chills, myalgias, N/V/D, vomiting.  Assessment/Plan: Unintended weight loss of approximately 15 pounds over the past 2 years with difficulty gaining weight.  Differential includes secondary to dietary and lifestyle changes versus malignancy given her night sweats versus absorption disorders versus hyperthyroidism.  Previous screening for cervical and breast cancer have been negative in this past year.  Her most high risk malignancy at this time given her long-term tobacco use history is lung cancer.  We will pursue a CT chest.  She would also benefit from repeat colonoscopy but will prioritize lung cancer evaluation.  CMP and TSH have also been obtained for further evaluation.   - CMP, TSH - CT Chest W contrast

## 2020-04-02 ENCOUNTER — Other Ambulatory Visit: Payer: Self-pay | Admitting: Internal Medicine

## 2020-04-02 DIAGNOSIS — E785 Hyperlipidemia, unspecified: Secondary | ICD-10-CM

## 2020-04-02 LAB — CMP14 + ANION GAP
ALT: 10 IU/L (ref 0–32)
AST: 17 IU/L (ref 0–40)
Albumin/Globulin Ratio: 2.3 — ABNORMAL HIGH (ref 1.2–2.2)
Albumin: 4.6 g/dL (ref 3.8–4.9)
Alkaline Phosphatase: 72 IU/L (ref 48–121)
Anion Gap: 18 mmol/L (ref 10.0–18.0)
BUN/Creatinine Ratio: 14 (ref 9–23)
BUN: 10 mg/dL (ref 6–24)
Bilirubin Total: 0.2 mg/dL (ref 0.0–1.2)
CO2: 22 mmol/L (ref 20–29)
Calcium: 9.5 mg/dL (ref 8.7–10.2)
Chloride: 98 mmol/L (ref 96–106)
Creatinine, Ser: 0.69 mg/dL (ref 0.57–1.00)
GFR calc Af Amer: 113 mL/min/{1.73_m2} (ref >59)
GFR calc non Af Amer: 98 mL/min/{1.73_m2} (ref >59)
Globulin, Total: 2 g/dL (ref 1.5–4.5)
Glucose: 74 mg/dL (ref 65–99)
Potassium: 3.7 mmol/L (ref 3.5–5.2)
Sodium: 138 mmol/L (ref 134–144)
Total Protein: 6.6 g/dL (ref 6.0–8.5)

## 2020-04-02 LAB — HIV ANTIBODY (ROUTINE TESTING W REFLEX): HIV Screen 4th Generation wRfx: NONREACTIVE

## 2020-04-02 LAB — HEPATITIS C ANTIBODY: Hep C Virus Ab: <0.1 s/co ratio (ref 0.0–0.9)

## 2020-04-02 LAB — TSH: TSH: 1.57 u[IU]/mL (ref 0.450–4.500)

## 2020-04-02 NOTE — Progress Notes (Signed)
Internal Medicine Clinic Attending  Case discussed with Dr. Basaraba at the time of the visit.  We reviewed the resident's history and exam and pertinent patient test results.  I agree with the assessment, diagnosis, and plan of care documented in the resident's note.    

## 2020-04-13 ENCOUNTER — Telehealth: Payer: Self-pay

## 2020-04-13 NOTE — Telephone Encounter (Signed)
Rtc, lm for rtc 

## 2020-04-13 NOTE — Telephone Encounter (Signed)
Please call pt back.

## 2020-04-13 NOTE — Telephone Encounter (Signed)
Requesting to speak with a nurse about letter, please call pt back.

## 2020-04-14 ENCOUNTER — Telehealth: Payer: Self-pay | Admitting: *Deleted

## 2020-04-14 ENCOUNTER — Other Ambulatory Visit: Payer: Self-pay

## 2020-04-14 ENCOUNTER — Ambulatory Visit: Payer: Self-pay | Admitting: Internal Medicine

## 2020-04-14 ENCOUNTER — Other Ambulatory Visit: Payer: Self-pay | Admitting: Internal Medicine

## 2020-04-14 VITALS — BP 144/80 | HR 87 | Temp 98.1°F | Ht 62.0 in | Wt 92.6 lb

## 2020-04-14 DIAGNOSIS — N611 Abscess of the breast and nipple: Secondary | ICD-10-CM

## 2020-04-14 MED ORDER — IBUPROFEN 800 MG PO TABS: 800.0000 mg | ORAL_TABLET | Freq: Four times a day (QID) | ORAL | 0 refills | Status: DC | PRN

## 2020-04-14 MED ORDER — SULFAMETHOXAZOLE-TRIMETHOPRIM 800-160 MG PO TABS: 1.0000 | ORAL_TABLET | Freq: Two times a day (BID) | ORAL | 0 refills | Status: DC

## 2020-04-14 NOTE — Telephone Encounter (Signed)
Attempted to call patient back to schedule appointment with [facility].No one answered phone. Let voicemail for patient to call me back.

## 2020-04-14 NOTE — Telephone Encounter (Signed)
Pt was seen this am in [facility]. 

## 2020-04-14 NOTE — Progress Notes (Signed)
   CC: breast pain  HPI:  [name] is a 54 y.o. with a PMHx as listed below who presents to the clinic for breast pain.   Please see the Encounters tab for problem-based Assessment & Plan regarding status of patient's acute and chronic conditions.  Past Medical History:  Diagnosis Date  . Breast pain, left 10/03/2019  . Hyperlipidemia   . Hypertension    Review of Systems: Review of Systems  Constitutional: Negative for chills and fever.  Respiratory: Negative for cough and shortness of breath.   Musculoskeletal: Negative for myalgias.       +swelling of left breast  Skin:       +erythema around left nipple  Neurological: Negative for dizziness and headaches.   Physical Exam:  Vitals:   04/14/20 1007  BP: (!) 144/80  Pulse: 87  Temp: 98.1 F (36.7 C)  TempSrc: Oral  SpO2: 100%  Weight: 92 lb 9.6 oz (42 kg)  Height: 5\' 2"  (1.575 m)   Physical Exam Vitals and nursing note reviewed.  Constitutional:      Appearance: She is normal weight.  Chest:     Breasts:        Right: Normal.        Left: Swelling (2 cm swelling located under the left nipple concerning for abscess), skin change and tenderness present.  Skin:    Comments: 3-4 cm of erythema surrounding the inferior aspect of the left nipple. No lesions or ulceration on skin surface. No purulent drainage  Neurological:     General: No focal deficit present.     Mental Status: She is alert and oriented to person, place, and time. Mental status is at baseline.  Psychiatric:        Mood and Affect: Mood normal.    Assessment & Plan:   See Encounters Tab for problem based charting.  Patient discussed with Dr. 

## 2020-04-14 NOTE — Patient Instructions (Signed)
It was nice seeing you today! Thank you for choosing [facility] for your Primary Care.    Today we talked about:   1. Left Breast Abscess: I sent in a prescription for an antibiotic called Bactrim. I also sent in Ibuprofen 800 mg. Please do not take these more often than every 6-8 hours. Do not take for more than 1 week.

## 2020-04-14 NOTE — Assessment & Plan Note (Signed)
Tiffany Hensley presents today with a 4 day history of left breast swelling and tenderness that she noticed develop suddenly overnight. She endorses redness around her left nipple that has not expanded. She denies fever, chills, myalgias, SOB.   She notes a recent history of abscess in the same region that was drained at the Breast Center. She contacted the clinic but was told she would need a referral to be seen again.   Assessment/Plan:  History and physical consistent with left breast abscess with possible overlying cellulitis. No systemic signs of infection at this time. Given this has required recent drainage by radiology, will place a referral back to the Breast Center for urgent re-evaluation. Will start patient on Bactrim for broad spectrum coverage, as she is a risk of MRSA given recent I&D.    - Referral to Breast Center - Bactrim DS BID x 10 days

## 2020-04-15 ENCOUNTER — Other Ambulatory Visit: Payer: Self-pay

## 2020-04-15 DIAGNOSIS — N611 Abscess of the breast and nipple: Secondary | ICD-10-CM

## 2020-04-15 NOTE — Progress Notes (Signed)
Internal Medicine Clinic Attending  Case discussed with Dr. Basaraba at the time of the visit.  We reviewed the resident's history and exam and pertinent patient test results.  I agree with the assessment, diagnosis, and plan of care documented in the resident's note.    

## 2020-04-23 ENCOUNTER — Other Ambulatory Visit: Payer: Self-pay

## 2020-04-23 ENCOUNTER — Ambulatory Visit: Payer: Self-pay | Admitting: *Deleted

## 2020-04-23 ENCOUNTER — Ambulatory Visit
Admission: RE | Admit: 2020-04-23 | Discharge: 2020-04-23 | Disposition: A | Discharge status: Home / Self Care | Payer: No Typology Code available for payment source | Source: Ambulatory Visit | Attending: Obstetrics and Gynecology | Admitting: Obstetrics and Gynecology

## 2020-04-23 VITALS — BP 109/63 | Temp 97.5°F | Wt 90.9 lb

## 2020-04-23 DIAGNOSIS — N6320 Unspecified lump in the left breast, unspecified quadrant: Secondary | ICD-10-CM

## 2020-04-23 DIAGNOSIS — N611 Abscess of the breast and nipple: Secondary | ICD-10-CM

## 2020-04-23 DIAGNOSIS — N644 Mastodynia: Secondary | ICD-10-CM

## 2020-04-23 DIAGNOSIS — Z1239 Encounter for other screening for malignant neoplasm of breast: Secondary | ICD-10-CM

## 2020-04-23 NOTE — Patient Instructions (Signed)
Explained breast self awareness with Tiffany Hensley. Patient did not need a Pap smear today due to last Pap smear was 10/03/2019. Let patient know that her next Pap smear is due in December 2021 since her last Pap smear was abnormal. Referred patient to the [facility] of [facility] for a left breast diagnostic mammogram. Appointment scheduled Thursday, April 23, 2020 at 1445. Patient aware of appointment and will be there. Tiffany Hensley verbalized understanding.  [name], [name], RN 11:59 AM

## 2020-04-23 NOTE — Progress Notes (Signed)
Ms. Xaria Judon Stamper is a 54 y.o. female who presents to [facility] clinic today with complaint of left breast pain, redness, and lump that cleared up 2-3 weeks ago that has reoccurred. Patient stated the pain is constant. Patient rates the pain at a 5 out of 10.   Pap Smear: Pap not smear completed today. Last Pap smear was 10/03/2019 at [facility] clinic and was ASCUS with negative HPV that was positive for Trichomonas. Per patient has no history of an abnormal Pap smear. Last Pap smear result is available in Epic.   Physical exam: Breasts Breasts symmetrical. No skin abnormalities right breast. Observed a reddened area left breast next to the nipple between 3 o'clock and 6 o'clock. No nipple retraction bilateral breasts. No nipple discharge bilateral breasts. No lymphadenopathy. No lumps palpated right breast. Palpated a lump within the left breast between 3 o'clock and 6 o'clock next to the nipple. Patient complained of pain on exam when palpated lump within the left breast.    Previous Exams:  12/24/2019: Breasts symmetrical. No skin abnormalities bilateral breasts. No nipple retraction bilateral breasts. No nipple discharge bilateral breasts. No lymphadenopathy. No lumps palpated right breast. 2.5cmx2cm mass palpated beneath nipple/with in areola at 5 o'clock with redness, pain on palpation, firm, round, smooth, mobile. Pt reports she has had this occur in the past and it will resolve spontaneously. Pt reports this mass was larger and more painful but is resolving.  10/03/2019:Breasts symmetrical. No skin abnormalities bilateral breasts. No nipple retraction bilateral breasts. No nipple discharge bilateral breasts. No lymphadenopathy. No lumps palpated bilateral breasts. No complaints of pain or tenderness on exam.   Pelvic/Bimanual Pap is not indicated today per BCCCP guidelines.   Smoking History: Patient is a current smoker. Discussed smoking cessation with patient. Referred to the [facility] Quitline  and gave resources to the free smoking cessation classes at [facility].   Patient Navigation: Patient education provided. Access to services provided for patient through BCCCP program.   Colorectal Cancer Screening: Per patient had a colonoscopy completed in 2015. No complaints today.    Breast and Cervical Cancer Risk Assessment: Patient does not have family history of breast cancer, known genetic mutations, or radiation treatment to the chest before age 23. Patient does not have history of cervical dysplasia, immunocompromised, or DES exposure in-utero.  Risk Assessment    Risk Scores      04/23/2020 12/24/2019   Last edited by: Narda Rutherford, LPN McGill, Fidel Levy, LPN   5-year risk: 1.8 % 1.8 %   Lifetime risk: 12.2 % 12.2 %          A: BCCCP exam without pap smear  P: Referred patient to the [facility] for a left breast diagnostic mammogram. Appointment scheduled Thursday, April 23, 2020 at 1445.  Priscille Heidelberg, RN 04/23/2020 11:59 AM

## 2020-05-01 ENCOUNTER — Telehealth: Payer: Self-pay | Admitting: *Deleted

## 2020-05-01 ENCOUNTER — Ambulatory Visit (HOSPITAL_COMMUNITY)
Admission: RE | Admit: 2020-05-01 | Discharge: 2020-05-01 | Disposition: A | Discharge status: Home / Self Care | Payer: Self-pay | Source: Ambulatory Visit | Attending: Student in an Organized Health Care Education/Training Program | Admitting: Student in an Organized Health Care Education/Training Program

## 2020-05-01 ENCOUNTER — Other Ambulatory Visit: Payer: Self-pay

## 2020-05-01 ENCOUNTER — Encounter (HOSPITAL_COMMUNITY): Payer: Self-pay

## 2020-05-01 DIAGNOSIS — R634 Abnormal weight loss: Secondary | ICD-10-CM | POA: Insufficient documentation

## 2020-05-01 MED ORDER — SODIUM CHLORIDE (PF) 0.9 % IJ SOLN
INTRAMUSCULAR | Status: AC
  Filled 2020-05-01: qty 50

## 2020-05-01 MED ORDER — IOHEXOL 300 MG/ML  SOLN
75.0000 mL | Freq: Once | INTRAMUSCULAR | Status: AC | PRN
  Administered 2020-05-01: 75 mL via INTRAVENOUS

## 2020-05-01 NOTE — Telephone Encounter (Signed)
Received call from [facility]-Pt scheduled for chest ct this morning, but order needs to be signed.  CMA unable to have attending MD sign order electronically-order printed, signed by attending, and faxed to [phone]. No further action needed, phone call complete.Tiffany Spittle Cassady7/16/202110:44 AM

## 2020-06-01 ENCOUNTER — Other Ambulatory Visit: Payer: Self-pay

## 2020-06-01 ENCOUNTER — Encounter: Payer: Self-pay | Admitting: Internal Medicine

## 2020-06-01 ENCOUNTER — Ambulatory Visit (INDEPENDENT_AMBULATORY_CARE_PROVIDER_SITE_OTHER): Payer: Self-pay | Admitting: Internal Medicine

## 2020-06-01 VITALS — BP 140/96 | HR 99 | Temp 99.5°F | Ht 62.0 in | Wt 90.1 lb

## 2020-06-01 DIAGNOSIS — R634 Abnormal weight loss: Secondary | ICD-10-CM

## 2020-06-01 DIAGNOSIS — I10 Essential (primary) hypertension: Secondary | ICD-10-CM

## 2020-06-01 MED ORDER — LISINOPRIL 10 MG PO TABS: 10.0000 mg | ORAL_TABLET | Freq: Every day | ORAL | 11 refills | Status: DC

## 2020-06-01 NOTE — Patient Instructions (Signed)
It was nice seeing you today! Thank you for choosing [facility] for your Primary Care.    Today we talked about:   1. Blood Pressure: We switched your medication to just Lisinopril 10 mg daily. Continue taking your home blood pressure and keep a log. Please bring this with your to your next visit.  2. Weight Loss: We will get the records of your last colonoscopy. Otherwise, no other work up right now. If you develop any new symptoms, please let me know. I also placed a referral to Lupita Leash, our dietician.

## 2020-06-01 NOTE — Progress Notes (Signed)
   CC: HTN and weight loss follow up  HPI:  Tiffany Hensley is a 54 y.o. with a PMHx as listed below who presents to the clinic for HTN and weight loss follow up.   Please see the Encounters tab for problem-based Assessment & Plan regarding status of patient's acute and chronic conditions.  Past Medical History:  Diagnosis Date  . Breast pain, left 10/03/2019  . Hyperlipidemia   . Hypertension    Review of Systems: Review of Systems  Constitutional: Positive for weight loss. Negative for chills and fever.       + night sweats, chronic  Respiratory: Negative for cough and shortness of breath.   Cardiovascular: Negative for chest pain, palpitations and leg swelling.  Gastrointestinal: Negative for abdominal pain, blood in stool, diarrhea, melena, nausea and vomiting.  Genitourinary: Negative for dysuria, frequency, hematuria and urgency.  Musculoskeletal: Negative for joint pain and myalgias.  Skin: Negative for itching and rash.  Neurological: Positive for dizziness (resolved). Negative for focal weakness, weakness and headaches.   Physical Exam:  Vitals:   06/01/20 1306 06/01/20 1313  BP: (!) 151/96 (!) 140/96  Pulse: 98 99  Temp: 99.5 F (37.5 C)   TempSrc: Oral   SpO2: 99%   Weight: 90 lb 1.6 oz (40.9 kg)   Height: 5\' 2"  (1.575 m)     Physical Exam Constitutional:      Appearance: She is underweight. She is not ill-appearing or toxic-appearing.  HENT:     Head: Normocephalic and atraumatic.  Cardiovascular:     Rate and Rhythm: Normal rate and regular rhythm.     Heart sounds: No murmur heard.  No gallop.   Pulmonary:     Effort: Pulmonary effort is normal. No respiratory distress.     Breath sounds: Normal breath sounds. No wheezing or rales.  Lymphadenopathy:     Cervical: No cervical adenopathy.  Skin:    General: Skin is warm and dry.     Comments: 1 cm mobile hard cyst located on left posterior neck with no central opening, erythema, TTP    Neurological:     General: No focal deficit present.     Mental Status: She is alert and oriented to person, place, and time. Mental status is at baseline.  Psychiatric:        Mood and Affect: Mood normal.        Behavior: Behavior normal.    Assessment & Plan:   See Encounters Tab for problem based charting.  Patient discussed with Dr. 

## 2020-06-01 NOTE — Assessment & Plan Note (Signed)
BP Readings from Last 3 Encounters:  06/01/20 (!) 140/96  04/23/20 109/63  04/14/20 (!) 144/80   Tiffany Hensley states that approximately 2 weeks ago, she noticed increasing dizziness when bending over to pick up boxes at work. She felt off all day and felt these symptoms were most likely from her BP medication. When she arrived home, she checked her BP and remembers her SBP was 115 and became worried. Due to this, she stopped her blood pressure medications. Since that time, she has been monitoring her BP daily with average SBP of 130. She denies any additional episodes of dizziness. She denies any chest pain or SOB with exercise or at rest.   Assessment/Plan:  BP at this visit is elevated compared to her home readings, but not as high as it was in February 2021. This may be secondary to her continued weight loss with an approximate 6.5 lbs decrease since that time. Given the benefits of tight BP control with a goal of <120/80, will add back on only Lisinopril. Will hold off on diuretics as they are most likely culprit of patient's dizziness.   - Start Lisinopril 10 mg daily  - 3 month follow up if tolerating well; instructed to follow up in 4 weeks if she notices any drastic high or low Bps, or re-develops dizziness

## 2020-06-01 NOTE — Assessment & Plan Note (Signed)
Wt Readings from Last 3 Encounters:  06/01/20 90 lb 1.6 oz (40.9 kg)  04/23/20 90 lb 14.4 oz (41.2 kg)  04/14/20 92 lb 9.6 oz (42 kg)   Tiffany Hensley states she has noticed continued weight loss despite not actively working towards that. She denies any fatigue, new lumps or bumps, skin changes, headaches, stool changes or greasy stool, urinary changes, vaginal bleeding. She notes a chronic history of intermittent night sweats that may have been worsening lately.   Assessment/Plan:  Tiffany Hensley continues to have weight loss, approximately 6.5 lbs in the last 6 months. This may still be secondary to her healthy diet change and decrease in weekly alcohol use. However, given her age, alternatives including malignancy have been investigated. This includes a recent CT chest (hx of tobacco use) that was negative for any enlarged lymph nodes or masses. Patient has yearly mammograms without any abnormal findings. She had a pap smear 8 months ago that showed ASCUS but negative for HPV. Last colonoscopy was either in 2015 or 2017, we are actively trying to get records at this time. She notes her father had colon cancer but no other colon cancer in the family.   At this time, will await colonoscopy result and recommend repeat based on that.   - Continue monitoring weight regularly  - Referral to nutritionist, Tiffany Hensley

## 2020-06-02 NOTE — Progress Notes (Signed)
Internal Medicine Clinic Attending  Case discussed with Dr. Basaraba  At the time of the visit.  We reviewed the resident's history and exam and pertinent patient test results.  I agree with the assessment, diagnosis, and plan of care documented in the resident's note.  

## 2020-06-03 ENCOUNTER — Telehealth: Payer: Self-pay | Admitting: Dietician

## 2020-06-03 NOTE — Telephone Encounter (Signed)
Left voicemail for return call  

## 2020-06-04 ENCOUNTER — Encounter: Payer: Self-pay | Admitting: Dietician

## 2020-06-04 NOTE — Telephone Encounter (Signed)
I have not heard back from this patient. Will mail letter.

## 2020-06-10 ENCOUNTER — Ambulatory Visit: Payer: No Typology Code available for payment source

## 2020-06-10 ENCOUNTER — Telehealth: Payer: Self-pay

## 2020-06-10 NOTE — Telephone Encounter (Signed)
Left message for patient about completing HC 3 for the [facility] program. Left name and number for patient to call back. 

## 2020-07-06 NOTE — Addendum Note (Signed)
Addended by: Neomia Dear on: 07/06/2020 04:06 PM   Modules accepted: Orders

## 2020-08-13 ENCOUNTER — Ambulatory Visit: Payer: Self-pay | Admitting: Student

## 2020-08-13 ENCOUNTER — Encounter: Payer: Self-pay | Admitting: Student

## 2020-08-13 ENCOUNTER — Other Ambulatory Visit: Payer: Self-pay

## 2020-08-13 VITALS — BP 173/83 | HR 66 | Temp 98.5°F | Ht 62.0 in | Wt 92.9 lb

## 2020-08-13 DIAGNOSIS — Z0000 Encounter for general adult medical examination without abnormal findings: Secondary | ICD-10-CM

## 2020-08-13 DIAGNOSIS — F17200 Nicotine dependence, unspecified, uncomplicated: Secondary | ICD-10-CM | POA: Insufficient documentation

## 2020-08-13 DIAGNOSIS — E785 Hyperlipidemia, unspecified: Secondary | ICD-10-CM

## 2020-08-13 DIAGNOSIS — R634 Abnormal weight loss: Secondary | ICD-10-CM

## 2020-08-13 DIAGNOSIS — I10 Essential (primary) hypertension: Secondary | ICD-10-CM

## 2020-08-13 DIAGNOSIS — N611 Abscess of the breast and nipple: Secondary | ICD-10-CM

## 2020-08-13 MED ORDER — SULFAMETHOXAZOLE-TRIMETHOPRIM 400-80 MG PO TABS: 1.0000 | ORAL_TABLET | Freq: Two times a day (BID) | ORAL | 0 refills | Status: AC

## 2020-08-13 MED ORDER — AMLODIPINE BESYLATE 5 MG PO TABS: 5.0000 mg | ORAL_TABLET | Freq: Every day | ORAL | 1 refills | Status: DC

## 2020-08-13 MED ORDER — LISINOPRIL 20 MG PO TABS: 20.0000 mg | ORAL_TABLET | Freq: Every day | ORAL | 1 refills | Status: DC

## 2020-08-13 NOTE — Assessment & Plan Note (Signed)
Patient has noted weight loss in recent visits this year. BMI is low at 16.99, although weight remains stable. Patient notes significant positive changes in her diet and weight loss associated with decreased alcohol consumption recently. She denies intentional calorie restriction and is not interested in meeting with nutritional educator to discuss currently. TSH, Hep C and HIV unremarkable. - Continue to monitor

## 2020-08-13 NOTE — Assessment & Plan Note (Signed)
Last total cholesterol was 226, LDL 117, and HDL 64 in February 2021. ASCVD risk 8.1%. She was started on Pravastatin 40mg  daily.  - Consider lipid profile and CMP next visit  - For now, continue Pravastatin 40mg  daily

## 2020-08-13 NOTE — Patient Instructions (Signed)
Tiffany Hensley,  Today, we discussed that your breast abscess does not have any pockets of fluid that would benefit from incision and drainage at this time. I have prescribed Bactrim to your pharmacy. Please take one pill in the morning and one pill in the evening. I have prescribed 5 days although you may stop taking this medication earlier if your symptoms resolve.   Your blood pressure was high today. I have increased your lisinopril to 20mg  daily and have added a new medication, amlodipine 5mg  to your regimen.   Please start taking 1 lisinopril and 1 amlodipine tablet daily.   Please return in November for your follow up visit to see how your blood pressure and abscess are doing. In the meantime, don't hesitate to call [phone] for any questions or concerns.  Take care,  Dr. December   Hypertension, Adult Hypertension is another name for high blood pressure. High blood pressure forces your heart to work harder to pump blood. This can cause problems over time. There are two numbers in a blood pressure reading. There is a top number (systolic) over a bottom number (diastolic). It is best to have a blood pressure that is below 120/80. Healthy choices can help lower your blood pressure, or you may need medicine to help lower it. What are the causes? The cause of this condition is not known. Some conditions may be related to high blood pressure. What increases the risk?  Smoking.  Having type 2 diabetes mellitus, high cholesterol, or both.  Not getting enough exercise or physical activity.  Being overweight.  Having too much fat, sugar, calories, or salt (sodium) in your diet.  Drinking too much alcohol.  Having long-term (chronic) kidney disease.  Having a family history of high blood pressure.  Age. Risk increases with age.  Race. You may be at higher risk if you are African American.  Gender. Men are at higher risk than women before age 53. After age 38, women are at  higher risk than men.  Having obstructive sleep apnea.  Stress. What are the signs or symptoms?  High blood pressure may not cause symptoms. Very high blood pressure (hypertensive crisis) may cause: ? Headache. ? Feelings of worry or nervousness (anxiety). ? Shortness of breath. ? Nosebleed. ? A feeling of being sick to your stomach (nausea). ? Throwing up (vomiting). ? Changes in how you see. ? Very bad chest pain. ? Seizures. How is this treated?  This condition is treated by making healthy lifestyle changes, such as: ? Eating healthy foods. ? Exercising more. ? Drinking less alcohol.  Your health care provider may prescribe medicine if lifestyle changes are not enough to get your blood pressure under control, and if: ? Your top number is above 130. ? Your bottom number is above 80.  Your personal target blood pressure may vary. Follow these instructions at home: Eating and drinking   If told, follow the DASH eating plan. To follow this plan: ? Fill one half of your plate at each meal with fruits and vegetables. ? Fill one fourth of your plate at each meal with whole grains. Whole grains include whole-wheat pasta, brown rice, and whole-grain bread. ? Eat or drink low-fat dairy products, such as skim milk or low-fat yogurt. ? Fill one fourth of your plate at each meal with low-fat (lean) proteins. Low-fat proteins include fish, chicken without skin, eggs, beans, and tofu. ? Avoid fatty meat, cured and processed meat, or chicken with skin. ? Avoid pre-made  or processed food.  Eat less than 1,500 mg of salt each day.  Do not drink alcohol if: ? Your doctor tells you not to drink. ? You are pregnant, may be pregnant, or are planning to become pregnant.  If you drink alcohol: ? Limit how much you use to:  0-1 drink a day for women.  0-2 drinks a day for men. ? Be aware of how much alcohol is in your drink. In the [place] one drink equals one 12 oz bottle of beer (355  mL), one 5 oz glass of wine (148 mL), or one 1 oz glass of hard liquor (44 mL). Lifestyle   Work with your doctor to stay at a healthy weight or to lose weight. Ask your doctor what the best weight is for you.  Get at least 30 minutes of exercise most days of the week. This may include walking, swimming, or biking.  Get at least 30 minutes of exercise that strengthens your muscles (resistance exercise) at least 3 days a week. This may include lifting weights or doing Pilates.  Do not use any products that contain nicotine or tobacco, such as cigarettes, e-cigarettes, and chewing tobacco. If you need help quitting, ask your doctor.  Check your blood pressure at home as told by your doctor.  Keep all follow-up visits as told by your doctor. This is important. Medicines  Take over-the-counter and prescription medicines only as told by your doctor. Follow directions carefully.  Do not skip doses of blood pressure medicine. The medicine does not work as well if you skip doses. Skipping doses also puts you at risk for problems.  Ask your doctor about side effects or reactions to medicines that you should watch for. Contact a doctor if you:  Think you are having a reaction to the medicine you are taking.  Have headaches that keep coming back (recurring).  Feel dizzy.  Have swelling in your ankles.  Have trouble with your vision. Get help right away if you:  Get a very bad headache.  Start to feel mixed up (confused).  Feel weak or numb.  Feel faint.  Have very bad pain in your: ? Chest. ? Belly (abdomen).  Throw up more than once.  Have trouble breathing. Summary  Hypertension is another name for high blood pressure.  High blood pressure forces your heart to work harder to pump blood.  For most people, a normal blood pressure is less than 120/80.  Making healthy choices can help lower blood pressure. If your blood pressure does not get lower with healthy choices,  you may need to take medicine. This information is not intended to replace advice given to you by your health care provider. Make sure you discuss any questions you have with your health care provider. Document Revised: 06/13/2018 Document Reviewed: 06/13/2018 Elsevier Patient Education  2020 [employer].

## 2020-08-13 NOTE — Assessment & Plan Note (Signed)
She states that she first noticed a small bump under her left nipple about 2 weeks ago that became sore, red, and more swollen after she wore a swimsuit at the beach last weekend. She endorses feelings of pressure that improved after the lesion spontaneously drained after she placed a heating pad over the area last night. She describes the drainage as serosanguinous, not purulent, although feels her abscess did not fully drain. She notes years of similar struggles with bilateral breast abscesses in the past since she had her daughter. She previously followed at the [facility] for frequent ultrasounds, and responded well to Bactrim as recently as June of this year. She notes she had this same area under her left nipple lanced by [facility] in July of this year and the lesion had resolved until now. She would prefer to be prescribed antibiotics, avoid aspiration at all costs, and jump to lancing in the future if indicated. She denies any fevers, chills, nausea, vomiting, other breast masses or lesions or personal or family history of breast cancer.  - Bedside ultrasound was performed, which showed cobblestone appearance of left breast, consistent with very dense breast tissue noted on previous imaging vs. Scarring from previous loculated infections; however, no drainable fluid collection was appreciated. - Will give Bactrim 400-80mg  twice daily for 5 days - Instructed patient she may stop taking Bactrim prior to 5 days if symptoms resolve  - Patient instructed to call for worsening symptoms or symptoms that do not resolve with Bactrim alone, fevers, chills, nausea, or other concerning symptoms. - Patient is due for bilateral screening mammography 09/2020

## 2020-08-13 NOTE — Assessment & Plan Note (Signed)
BP Readings from Last 3 Encounters:  08/13/20 (!) 173/83  06/01/20 (!) 140/96  04/23/20 109/63   Patient's blood pressure is significantly elevated today, on lisinopril 10mg  only. She had previously been on HCTZ 12.5mg  in addition, though this was discontinued due to dizziness concerning for orthostasis. She does note she has had headaches intermittently, though none have been severe, and denies any vision changes or CP.  - Will increase lisinopril to 20mg  daily  - Will start amlodipine 5mg  daily

## 2020-08-13 NOTE — Progress Notes (Signed)
   CC: left breast abscess   HPI:  [name] is a 54 y.o. 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 She states that she first noticed a small bump under her left nipple about 2 weeks ago that became sore, red, and more swollen after she wore a swimsuit at the beach last weekend. She endorses feelings of pressure that improved after the lesion spontaneously drained after she placed a heating pad over the area last night. She describes the drainage as serosanguinous, not purulent, although feels her abscess did not fully drain. She notes years of similar struggles with bilateral breast abscesses in the past since she had her daughter. She previously followed at the [facility] for frequent ultrasounds, and responded well to Bactrim as recently as June of this year. She notes she had this same area under her left nipple lanced by [facility] in July of this year and the lesion had resolved until now. She would prefer to be prescribed antibiotics, avoid aspiration at all costs, and jump to lancing in the future if indicated. She denies any fevers, chills, nausea, vomiting, other breast masses or lesions or personal or family history of breast cancer.  Past Medical History:  Diagnosis Date  . Breast pain, left 10/03/2019  . Hyperlipidemia   . Hypertension    Family History  Problem Relation Age of Onset  . Colon cancer Other   . Hypertension Sister   . Hypertension Mother    Social History: Patient states she continues to smoke about 4 cigarettes per day. She says she is not interested in quitting at this time. She has significantly cut back her alcohol intake, from several drinks daily to an occasional drink. She denies any illicit drug use.   Review of Systems:  All others negative except as noted above in HPI.   Physical Exam:  Vitals:   08/13/20  1034 08/13/20 1102  BP: (!) 179/87 (!) 173/83  Pulse: 82 66  Temp: 98.5 F (36.9 C)   TempSrc: Oral   SpO2: 98%   Weight: 92 lb 14.4 oz (42.1 kg)   Height: 5\' 2"  (1.575 m)    General: Patient is thin. She otherwise appears well in no acute distress. Eyes: Sclera non-icteric. No conjunctival injection.  Respiratory: Lungs are CTA, bilaterally. No wheezes, rales, or rhonchi.  Cardiovascular: Regular rate and rhythm. No murmurs, rubs, or gallops. No lower extremity edema. Skin: There is a palpable roughly 1x1cm mobile, firm, superficial lesion under patient's inferolateral areolar area of the left breast with very minimal surrounding erythema, without retraction or active drainage. No other breast mass palpated. No nipple drainage. No evidence of hydradenitis suppurativa of the left axilla. No other lesions or rashes noted.  Neurological: Patient is alert and oriented.  Psych: Normal affect. Normal tone of voice.   Assessment & Plan:   See Encounters Tab for problem based charting.  Patient seen with Dr. .  Oswaldo Done, MD 08/13/2020, 9:17 PM Pager: [phone]

## 2020-08-13 NOTE — Assessment & Plan Note (Signed)
Patient currently smokes 4 cigarettes per day and is not currently interested in quitting. She is pre-contemplative but open to discussions regarding possible cessation.  - Continue motivational interviewing

## 2020-08-14 NOTE — Progress Notes (Signed)
Internal Medicine Clinic Attending  I saw and evaluated the patient.  I personally confirmed the key portions of the history and exam documented by Dr. Laddie Aquas and I reviewed pertinent patient test results.  The assessment, diagnosis, and plan were formulated together and I agree with the documentation in the resident's note. Left breast with tenderness and small amount of errythema at midline inferior to areola. No abscess seen on exam or by ultrasound. Agree with short course of antibiotic.

## 2020-08-26 ENCOUNTER — Ambulatory Visit: Payer: No Typology Code available for payment source

## 2020-10-29 ENCOUNTER — Other Ambulatory Visit: Payer: Self-pay

## 2020-10-29 DIAGNOSIS — E785 Hyperlipidemia, unspecified: Secondary | ICD-10-CM

## 2020-10-29 DIAGNOSIS — I10 Essential (primary) hypertension: Secondary | ICD-10-CM

## 2020-10-29 MED ORDER — PRAVASTATIN SODIUM 40 MG PO TABS: 40.0000 mg | ORAL_TABLET | Freq: Every day | ORAL | 1 refills | Status: DC

## 2020-10-29 MED ORDER — AMLODIPINE BESYLATE 5 MG PO TABS: 5.0000 mg | ORAL_TABLET | Freq: Every day | ORAL | 1 refills | Status: DC

## 2020-10-29 MED ORDER — LISINOPRIL 20 MG PO TABS: 20.0000 mg | ORAL_TABLET | Freq: Every day | ORAL | 1 refills | Status: DC

## 2020-10-29 NOTE — Telephone Encounter (Signed)
amLODipine (NORVASC) 5 MG tablet(Expired),  lisinopril (ZESTRIL) 20 MG tablet, refill request @  [id number] [place], [place] - [address] Phone:  [phone]  Fax:  [phone]

## 2020-12-23 ENCOUNTER — Other Ambulatory Visit: Payer: Self-pay | Admitting: Internal Medicine

## 2020-12-23 DIAGNOSIS — I10 Essential (primary) hypertension: Secondary | ICD-10-CM

## 2020-12-24 ENCOUNTER — Other Ambulatory Visit: Payer: Self-pay | Admitting: Internal Medicine

## 2020-12-24 DIAGNOSIS — I10 Essential (primary) hypertension: Secondary | ICD-10-CM

## 2021-01-13 ENCOUNTER — Ambulatory Visit: Payer: Self-pay | Admitting: Student

## 2021-01-13 ENCOUNTER — Encounter: Payer: Self-pay | Admitting: Student

## 2021-01-13 ENCOUNTER — Other Ambulatory Visit: Payer: Self-pay

## 2021-01-13 VITALS — BP 149/71 | HR 85 | Temp 98.3°F | Ht 62.0 in | Wt 92.3 lb

## 2021-01-13 DIAGNOSIS — N611 Abscess of the breast and nipple: Secondary | ICD-10-CM

## 2021-01-13 MED ORDER — TRAMADOL HCL 50 MG PO TABS: 50.0000 mg | ORAL_TABLET | Freq: Four times a day (QID) | ORAL | 0 refills | Status: AC | PRN

## 2021-01-13 MED ORDER — DOXYCYCLINE MONOHYDRATE 100 MG PO TABS: 100.0000 mg | ORAL_TABLET | Freq: Two times a day (BID) | ORAL | 0 refills | Status: AC

## 2021-01-13 NOTE — Patient Instructions (Addendum)
Ms. Luera,  It was a pleasure seeing you today!  Today we discussed the infection on your breast. I have prescribed antibiotics and pain medication. I have also placed a referral for the breast clinic.  Please make sure to arrive 15 minutes prior to your next appointment. If you arrive late, you may be asked to reschedule.   We look forward to seeing you next time. Please call our clinic at [phone] if you have any questions or concerns. The best time to call is Monday-Friday from 9am-4pm, but there is someone available 24/7. If after hours or the weekend, call the main hospital number and ask for the Internal Medicine Resident On-Call. If you need medication refills, please notify your pharmacy one week in advance and they will send [place] a request.  Thank you for letting us take part in your care. Wishing you the best!  Thank you, Dr. Evlyn Kanner, MD

## 2021-01-13 NOTE — Progress Notes (Signed)
   CC: breast infection  HPI:  Tiffany Hensley is a 55 y.o. with medical history below presenting to [facility] for evaluation of infection on left breast.  Please see problem-based list for further details, assessments, and plans.  Past Medical History:  Diagnosis Date  . Breast pain, left 10/03/2019  . Hyperlipidemia   . Hypertension    Review of Systems:  As per HPI  Physical Exam:  Vitals:   01/13/21 1404  BP: (!) 149/71  Pulse: 85  Temp: 98.3 F (36.8 C)  TempSrc: Oral  SpO2: 100%  Weight: 92 lb 4.8 oz (41.9 kg)  Height: 5\' 2"  (1.575 m)   General: Sitting in chair in no acute distress Skin: Exquisitely tender, erythematous lesion under patient's left areola. Lesion is palpable, mobile, but firm. No drainage appreciated. Psych: Normal affect, speech.   Assessment & Plan:   See Encounters Tab for problem based charting.  Patient discussed with Dr. 

## 2021-01-14 NOTE — Assessment & Plan Note (Addendum)
Patient presenting to [facility] for localized left breast infection. Tiffany Hensley had similar presentation last year, to which she completed a course of antibiotics and had to have the abscess aspirated via [facility]. She reports this new infection has been occurring over the last few days and is in the same spot as the previous infection. Mentions it is extremely tender to touch, making it difficult to wear a bra or any clothing over it. Denies fevers, chills.  A/P: Plan for antibiotics and ultrasound for further characterization of localized infection. Will also refer to breast clinic and general surgery for possible aspiration of abscess. - Doxycycline 100mg  BID x7d - Tramadol 50mg  q6h x5d - L breast ultrasound - Referral breast clinic

## 2021-01-18 NOTE — Progress Notes (Signed)
Internal Medicine Clinic Attending  I saw and evaluated the patient.  I personally confirmed the key portions of the history and exam documented by Dr. Braswell and I reviewed pertinent patient test results.  The assessment, diagnosis, and plan were formulated together and I agree with the documentation in the resident's note.  

## 2021-02-02 ENCOUNTER — Other Ambulatory Visit: Payer: Self-pay | Admitting: Internal Medicine

## 2021-02-02 DIAGNOSIS — N611 Abscess of the breast and nipple: Secondary | ICD-10-CM

## 2021-02-15 IMAGING — CT CT CHEST W/ CM
2 of 4 series · 15 of 36 positions shown, 18 images · IV contrast (omnipaque)
Comparison: None.

CLINICAL DATA: Unintended weight loss.  History of tobacco use

EXAM:
CT CHEST WITH CONTRAST
TECHNIQUE: Multidetector CT imaging of the chest was performed during
intravenous contrast administration.
CONTRAST:  75mL OMNIPAQUE IOHEXOL 300 MG/ML  SOLN

[Series 2: axial st · axial · 0.65mm/px · z∈[-448,-162]mm · 12 of 171 slices shown, 15 images]
[im 14/171  mediastinal]
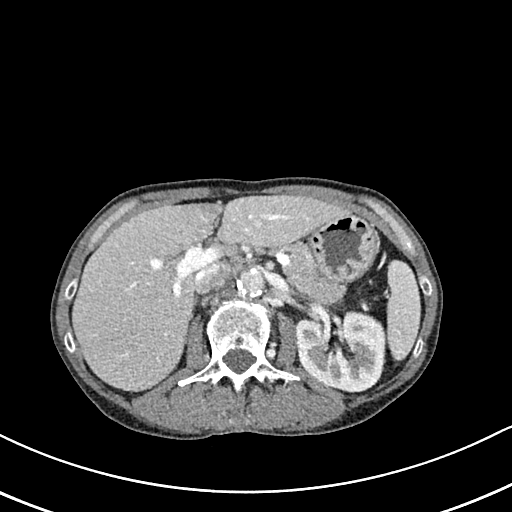
[im 14/171  lung]
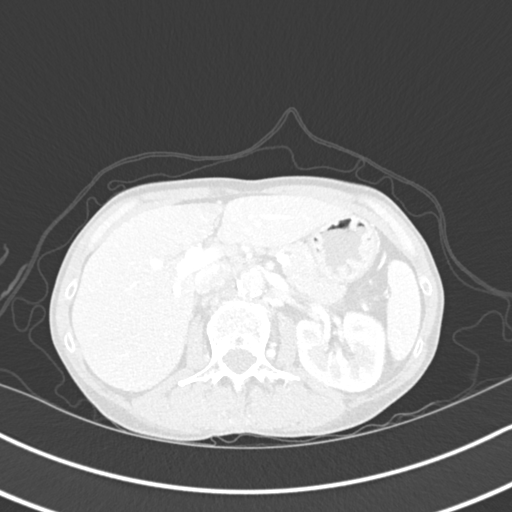
[im 27/171  lung]
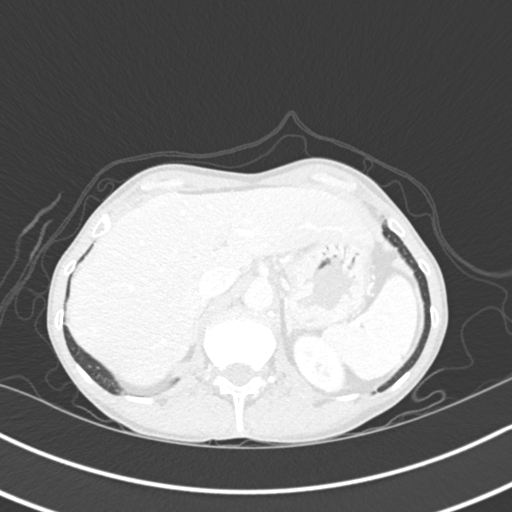
[im 40/171  lung]
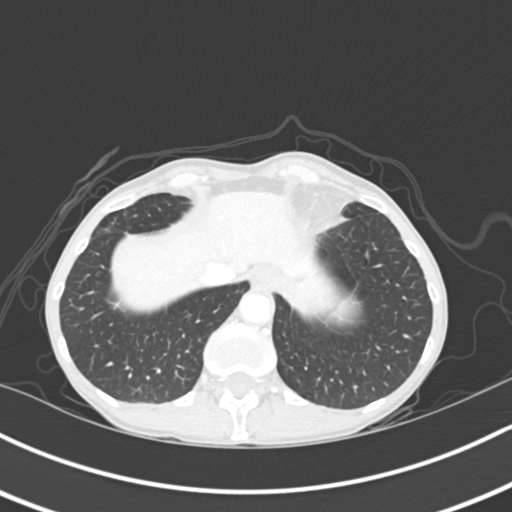
[im 53/171  lung]
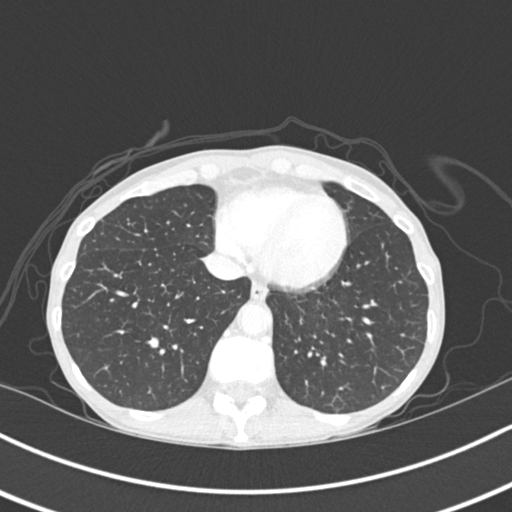
[im 66/171  mediastinal]
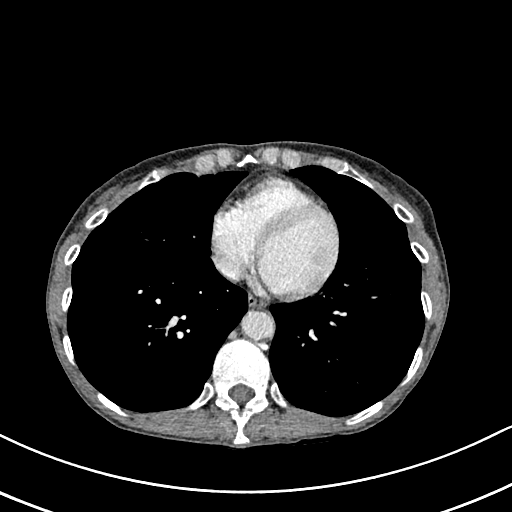
[im 66/171  lung]
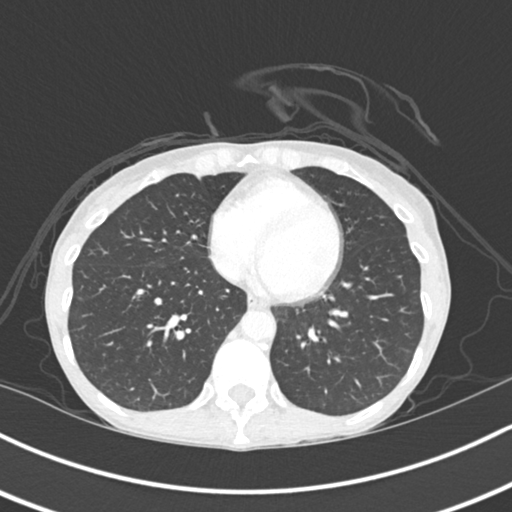
[im 79/171  lung]
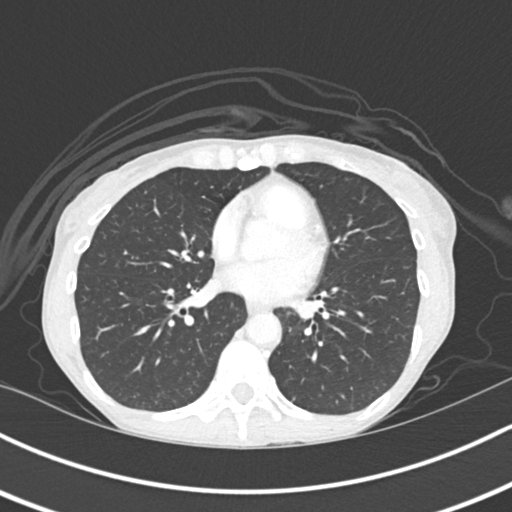
[im 92/171  lung]
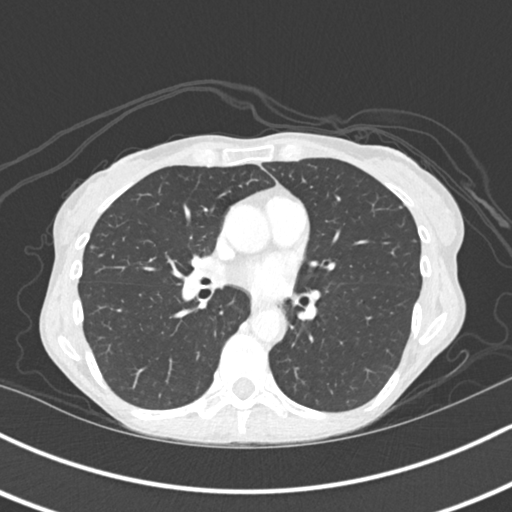
[im 105/171  lung]
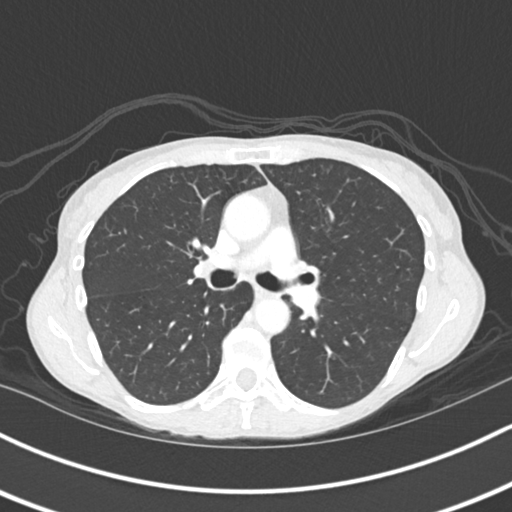
[im 118/171  mediastinal]
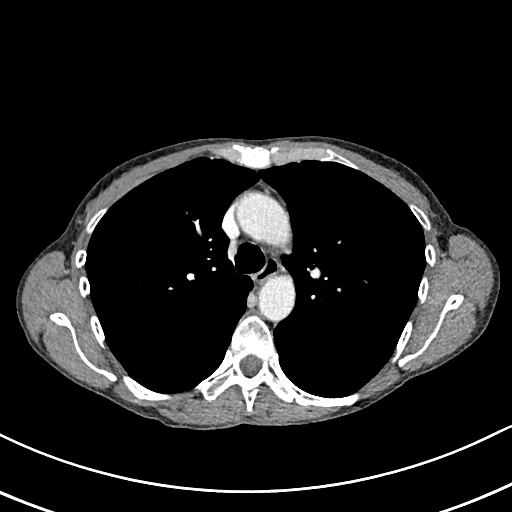
[im 118/171  lung]
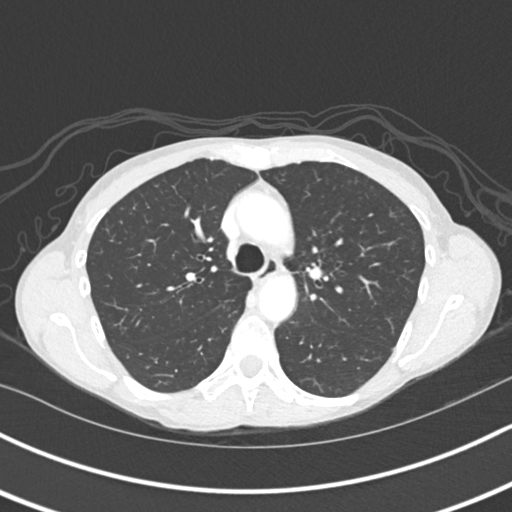
[im 131/171  lung]
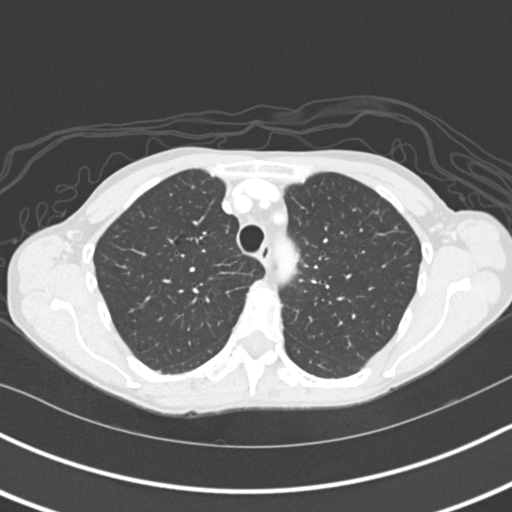
[im 144/171  lung]
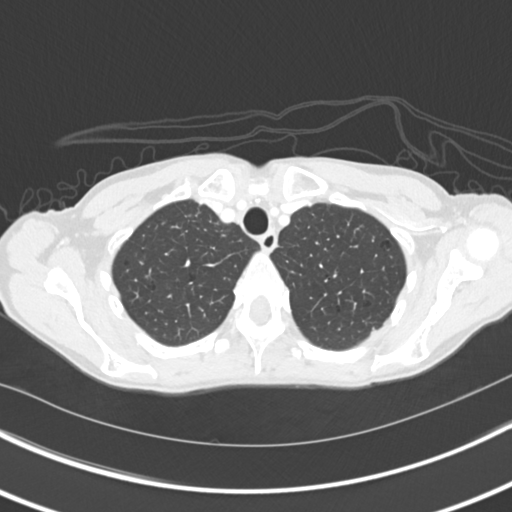
[im 157/171  lung]
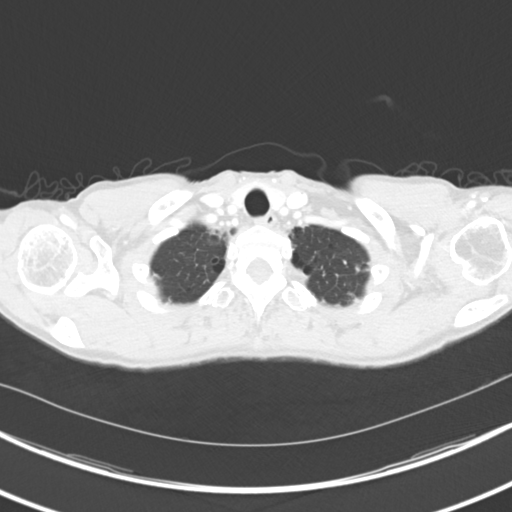

[Series 5: coronal · coronal · 0.79mm/px · 3 of 100 slices shown]
[im 20/100  lung]
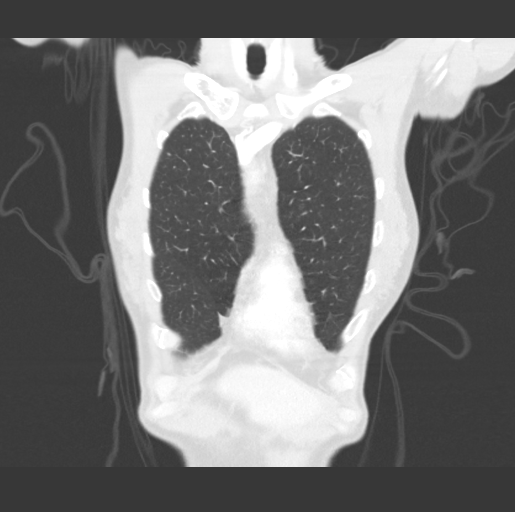
[im 40/100  lung]
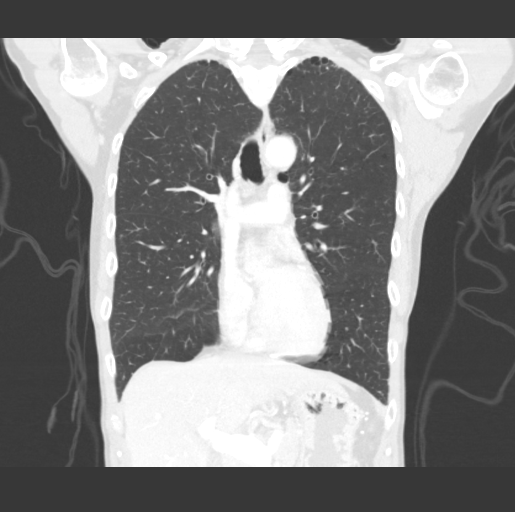
[im 60/100  lung]
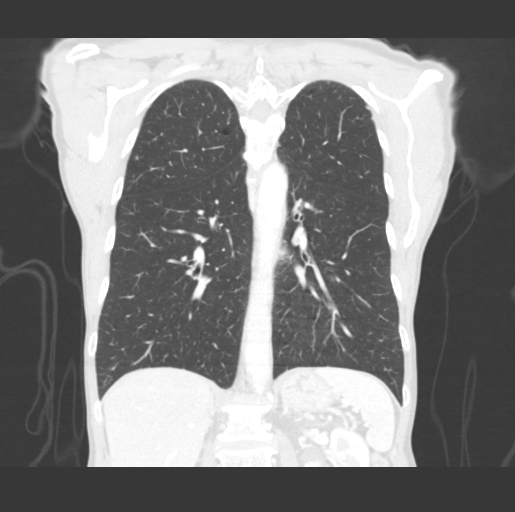

[15 of 36 positions shown; findings below may reference images not displayed]

FINDINGS: Cardiovascular: Heart is normal size. Aorta is normal caliber.
Calcifications in left circumflex coronary artery and aorta.
Irregular noncalcified plaque in the descending thoracic aorta. No
aneurysm.

Mediastinum/Nodes: No mediastinal, hilar, or axillary adenopathy.
Trachea and esophagus are unremarkable. Thyroid unremarkable.

Lungs/Pleura: Paraseptal emphysema in the apices. Mild centrilobular
emphysema. No confluent opacities or effusions.

Upper Abdomen: Imaging into the upper abdomen shows no acute
findings.

Musculoskeletal: Chest wall soft tissues are unremarkable. No acute
bony abnormality.
IMPRESSION: No acute cardiopulmonary disease.

Coronary artery disease.

Aortic Atherosclerosis (R97N8-C0J.J) and Emphysema (R97N8-W0V.J).

## 2021-03-06 ENCOUNTER — Other Ambulatory Visit: Payer: Self-pay | Admitting: Student

## 2021-03-06 DIAGNOSIS — I10 Essential (primary) hypertension: Secondary | ICD-10-CM

## 2021-05-09 ENCOUNTER — Other Ambulatory Visit: Payer: Self-pay | Admitting: Internal Medicine

## 2021-05-09 DIAGNOSIS — I10 Essential (primary) hypertension: Secondary | ICD-10-CM

## 2021-07-10 ENCOUNTER — Other Ambulatory Visit: Payer: Self-pay | Admitting: Internal Medicine

## 2021-07-10 DIAGNOSIS — I10 Essential (primary) hypertension: Secondary | ICD-10-CM

## 2021-09-12 ENCOUNTER — Other Ambulatory Visit: Payer: Self-pay | Admitting: Internal Medicine

## 2021-09-12 DIAGNOSIS — I10 Essential (primary) hypertension: Secondary | ICD-10-CM

## 2021-11-24 ENCOUNTER — Other Ambulatory Visit: Payer: Self-pay | Admitting: Internal Medicine

## 2021-11-24 DIAGNOSIS — I10 Essential (primary) hypertension: Secondary | ICD-10-CM

## 2021-12-09 ENCOUNTER — Encounter: Payer: Self-pay | Admitting: Internal Medicine

## 2022-01-24 ENCOUNTER — Other Ambulatory Visit: Payer: Self-pay | Admitting: Internal Medicine

## 2022-01-24 DIAGNOSIS — I10 Essential (primary) hypertension: Secondary | ICD-10-CM

## 2022-03-07 ENCOUNTER — Encounter: Payer: Self-pay | Admitting: Internal Medicine

## 2022-03-08 ENCOUNTER — Encounter: Payer: Self-pay | Admitting: Internal Medicine

## 2022-04-10 ENCOUNTER — Other Ambulatory Visit: Payer: Self-pay | Admitting: Internal Medicine

## 2022-04-10 DIAGNOSIS — E785 Hyperlipidemia, unspecified: Secondary | ICD-10-CM

## 2022-04-10 DIAGNOSIS — I10 Essential (primary) hypertension: Secondary | ICD-10-CM

## 2022-04-12 MED ORDER — PRAVASTATIN SODIUM 40 MG PO TABS: 40.0000 mg | ORAL_TABLET | Freq: Every day | ORAL | 1 refills | Status: AC

## 2022-05-12 ENCOUNTER — Encounter: Payer: Self-pay | Admitting: Internal Medicine

## 2022-05-17 ENCOUNTER — Other Ambulatory Visit: Payer: Self-pay | Admitting: Internal Medicine

## 2022-05-17 DIAGNOSIS — I10 Essential (primary) hypertension: Secondary | ICD-10-CM

## 2022-06-15 ENCOUNTER — Other Ambulatory Visit: Payer: Self-pay | Admitting: Internal Medicine

## 2022-06-15 DIAGNOSIS — I10 Essential (primary) hypertension: Secondary | ICD-10-CM

## 2022-07-09 ENCOUNTER — Other Ambulatory Visit: Payer: Self-pay

## 2022-07-09 ENCOUNTER — Other Ambulatory Visit: Payer: Self-pay | Admitting: Internal Medicine

## 2022-07-09 DIAGNOSIS — I10 Essential (primary) hypertension: Secondary | ICD-10-CM

## 2022-07-11 NOTE — Telephone Encounter (Signed)
LOV 12/2020 - called pt to schedule an appt; no answer, left message to call the office.

## 2022-07-11 NOTE — Telephone Encounter (Signed)
LOV 12/2020 - called pt to schedule an appt; no answer, left message to call the office. 

## 2022-07-13 NOTE — Telephone Encounter (Signed)
Patient has no showed her last three office visits and was also sent no show letters each time making her aware.  Forwarding to Engineer, building services, Doris to see what steps need to be taken at this time.

## 2022-07-15 ENCOUNTER — Encounter: Payer: Self-pay | Admitting: Internal Medicine

## 2022-07-15 NOTE — Progress Notes (Signed)
Notified that Ms. Tiffany Hensley has not responded to phone calls or letters requesting that she schedule an appointment, and that she has had multiple no-shows.  Refills for antihypertensive medicines have been authorized by [facility] physicians for antihypertensive medications, though patient has not been seen in our clinic since 01/13/21.  I placed call to both her cell (left message) and her home phone (rings without vm option).   I called [facility] on [address] and confirmed that Tiffany Hensley is picking up her refills (she is), and confirmed that we have the same contact info (we do).    Pharmacy staff did not identify any other prescribers.  I asked her to write a note on the prescriptions (yet to be picked up, refilled a couple days ago), that no further refills will be authorized by [facility] until she schedules an appointment with [place].  EMR review shows that she has been associated with [facility], and independent practice in [place], phone [phone].  I have called all of the available lines in the phone tree and call was not picked up.  I left a mssg requesting a return call to find out if she is being seen elsewhere.  This particular practice is very accessible, both for walk-ins and for chronic care, and is open 7 days/week.  I have requested that a certified letter be sent to Tiffany Hensley stating that regretfully we will no longer be able to provide medical services.  She will remain under our care for 30 days following expected receipt of that letter.  Dr. Jimmye Norman

## 2022-11-25 ENCOUNTER — Emergency Department (HOSPITAL_COMMUNITY)
Admission: EM | Admit: 2022-11-25 | Discharge: 2022-11-25 | Disposition: A | Discharge status: Home / Self Care | Payer: Self-pay | Attending: Emergency Medicine | Admitting: Emergency Medicine

## 2022-11-25 ENCOUNTER — Emergency Department (HOSPITAL_COMMUNITY): Payer: Self-pay

## 2022-11-25 ENCOUNTER — Encounter (HOSPITAL_COMMUNITY): Payer: Self-pay

## 2022-11-25 ENCOUNTER — Other Ambulatory Visit: Payer: Self-pay

## 2022-11-25 DIAGNOSIS — S52612A Displaced fracture of left ulna styloid process, initial encounter for closed fracture: Secondary | ICD-10-CM | POA: Insufficient documentation

## 2022-11-25 DIAGNOSIS — S62102A Fracture of unspecified carpal bone, left wrist, initial encounter for closed fracture: Secondary | ICD-10-CM

## 2022-11-25 DIAGNOSIS — S52502A Unspecified fracture of the lower end of left radius, initial encounter for closed fracture: Secondary | ICD-10-CM | POA: Insufficient documentation

## 2022-11-25 DIAGNOSIS — W19XXXA Unspecified fall, initial encounter: Secondary | ICD-10-CM | POA: Insufficient documentation

## 2022-11-25 MED ORDER — HYDROCODONE-ACETAMINOPHEN 5-325 MG PO TABS
1.0000 | ORAL_TABLET | Freq: Once | ORAL | Status: AC
  Administered 2022-11-25: 1 via ORAL
  Filled 2022-11-25: qty 1

## 2022-11-25 MED ORDER — HYDROCODONE-ACETAMINOPHEN 5-325 MG PO TABS: 1.0000 | ORAL_TABLET | Freq: Four times a day (QID) | ORAL | 0 refills | Status: DC | PRN

## 2022-11-25 NOTE — Progress Notes (Signed)
Orthopedic Tech Progress Note Patient Details:  Tiffany Hensley [name] 12/06/1965 [id number]  Ortho Devices Type of Ortho Device: Sugartong splint, Arm sling Ortho Device/Splint Location: LUE Ortho Device/Splint Interventions: Application   Post Interventions Patient Tolerated: Well Instructions Provided: Care of device  [name] 11/25/2022, 2:49 PM

## 2022-11-25 NOTE — Discharge Instructions (Signed)
Return for any problem.   Call Dr. Caralyn Guile as instructed for close outpatient followup.

## 2022-11-25 NOTE — ED Provider Notes (Signed)
[place] EMERGENCY DEPARTMENT AT [facility] Provider Note   CSN: [id number] Arrival date & time: 11/25/22  1341     History  Chief Complaint  Patient presents with   Tiffany Hensley is a 57 y.o. female.  57 year old female with prior medical history as detailed below presents for evaluation.  Patient was rollerskating yesterday.  Patient fell on her outstretched left hand.  She had immediate pain to the left wrist.  Patient went to urgent care today for evaluation of her left wrist pain.  Patient was diagnosed with left distal radius and ulna fracture at urgent care.  Patient was sent to the ED for further evaluation and orthopedic referral.  Patient without other injury related to fall yesterday.  The history is provided by medical records and the patient.       Home Medications Prior to Admission medications   Medication Sig Start Date End Date Taking? Authorizing Provider  HYDROcodone-acetaminophen (NORCO/VICODIN) 5-325 MG tablet Take 1 tablet by mouth every 6 (six) hours as needed. 11/25/22  Yes Valarie Merino, MD  amLODipine (NORVASC) 5 MG tablet TAKE 1 TABLET (5 MG TOTAL) BY MOUTH DAILY. 07/12/22 09/10/22  Linus Galas, MD  lisinopril (ZESTRIL) 20 MG tablet TAKE 1 TABLET BY MOUTH EVERY DAY 07/12/22   Linus Galas, MD  pravastatin (PRAVACHOL) 40 MG tablet Take 1 tablet (40 mg total) by mouth daily. 04/12/22   Jose Persia, MD      Allergies    Patient has no known allergies.    Review of Systems   Review of Systems  All other systems reviewed and are negative.   Physical Exam Updated Vital Signs BP (!) 152/93 (BP Location: Right Arm)   Pulse 90   Temp 97.9 F (36.6 C) (Oral)   Resp 18   SpO2 100%  Physical Exam Vitals and nursing note reviewed.  Constitutional:      General: She is not in acute distress.    Appearance: Normal appearance. She is well-developed.  HENT:     Head: Normocephalic and  atraumatic.  Eyes:     Conjunctiva/sclera: Conjunctivae normal.     Pupils: Pupils are equal, round, and reactive to light.  Cardiovascular:     Rate and Rhythm: Normal rate and regular rhythm.     Heart sounds: Normal heart sounds.  Pulmonary:     Effort: Pulmonary effort is normal. No respiratory distress.     Breath sounds: Normal breath sounds.  Abdominal:     General: There is no distension.     Palpations: Abdomen is soft.     Tenderness: There is no abdominal tenderness.  Musculoskeletal:        General: Swelling and tenderness present. No deformity.     Cervical back: Normal range of motion and neck supple.     Comments: Diffuse tenderness and swelling around the distal aspect of the left radius and ulna.  Distal left upper extremity is neurovascular intact.  Skin:    General: Skin is warm and dry.  Neurological:     General: No focal deficit present.     Mental Status: She is alert and oriented to person, place, and time.     ED Results / Procedures / Treatments   Labs (all labs ordered are listed, but only abnormal results are displayed) Labs Reviewed - No data to display  EKG None  Radiology DG Wrist Complete Left  Result Date: 11/25/2022 CLINICAL DATA:  Fall and  pain EXAM: LEFT WRIST - COMPLETE 3+ VIEW COMPARISON:  None Available. FINDINGS: Degenerative changes at the base of the thumb. Comminuted, intra-articular and impacted distal radius fracture with dorsal angulation and displacement of distal fracture fragment. Minimally comminuted ulnar styloid fracture. Diffuse soft tissue swelling. IMPRESSION: Comminuted distal radius and ulnar fractures, as detailed above. Electronically Signed   By: Abigail Miyamoto M.D.   On: 11/25/2022 14:24    Procedures Procedures    Medications Ordered in ED Medications  HYDROcodone-acetaminophen (NORCO/VICODIN) 5-325 MG per tablet 1 tablet (1 tablet Oral Given 11/25/22 1446)    ED Course/ Medical Decision Making/ A&P                              Medical Decision Making Risk Prescription drug management.    Medical Screen Complete  This patient presented to the ED with complaint of left wrist pain, fracture.  This complaint involves an extensive number of treatment options. The initial differential diagnosis includes, but is not limited to, fracture of left wrist  This presentation is: Acute, Self-Limited, Previously Undiagnosed, Uncertain Prognosis, Complicated, Systemic Symptoms, and Threat to Life/Bodily Function  Patient is presenting with fall yesterday with sustained injury to left wrist. Urgent care diagnosed patient with distal left radius and ulna fractures.  Patient sent to the ED for evaluation.  Hilbert Odor, covering hand, is aware of case and reviews imaging.  Patient is appropriate for splinting as is.  Patient should follow-up with Dr. Caralyn Guile for outpatient follow-up of fracture.  Patient understands need for close outpatient follow-up.  Patient is comfortable post splint placement.  Strict return precautions given and understood.   Additional history obtained:  External records from outside sources obtained and reviewed including prior ED visits and prior Inpatient records.   Imaging Studies ordered:  I ordered imaging studies including left wrist I independently visualized and interpreted obtained imaging which showed distal left wrist ulna and radius fractures I agree with the radiologist interpretation.   Medicines ordered:  I ordered medication including Norco for pain Reevaluation of the patient after these medicines showed that the patient: improved   Problem List / ED Course:  Left wrist fracture   Reevaluation:  After the interventions noted above, I reevaluated the patient and found that they have: improved    Disposition:  After consideration of the diagnostic results and the patients response to treatment, I feel that the patent would benefit from close  outpatient follow-up.          Final Clinical Impression(s) / ED Diagnoses Final diagnoses:  Closed fracture of left wrist, initial encounter    Rx / DC Orders ED Discharge Orders          Ordered    HYDROcodone-acetaminophen (NORCO/VICODIN) 5-325 MG tablet  Every 6 hours PRN        11/25/22 1452              Valarie Merino, MD 11/25/22 1529

## 2022-11-25 NOTE — ED Triage Notes (Signed)
Roller skating yesterday and fell hurting her left wrist. Pt went to UC and had xrays and sent her here.

## 2022-12-06 ENCOUNTER — Encounter (HOSPITAL_COMMUNITY): Payer: Self-pay | Admitting: Orthopedic Surgery

## 2022-12-06 NOTE — Progress Notes (Signed)
PCP/Internal Med - [facility] Cardiologist - n/a  Chest x-ray - n/a EKG - DOS Stress Test - n/a ECHO - n/a Cardiac Cath - n/a  ICD Pacemaker/Loop - n/a  Sleep Study -  n/a CPAP - none  Diabetes - n/a  ERAS: Clear liquids til 12:30 PM DOS.  Anesthesia review: No  STOP now taking any Aspirin (unless otherwise instructed by your surgeon), Aleve, Naproxen, Ibuprofen, Motrin, Advil, Goody's, BC's, all herbal medications, fish oil, and all vitamins.   Coronavirus Screening Do you have any of the following symptoms:  Cough yes/no: No Fever (>100.47F)  yes/no: No Runny nose yes/no: No Sore throat yes/no: No Difficulty breathing/shortness of breath  yes/no: No  Have you traveled in the last 14 days and where? yes/no: No  Patient verbalized understanding of instructions that were given via phone,

## 2022-12-07 NOTE — H&P (Signed)
Tiffany Hensley is an 57 y.o. female.   Chief Complaint:Left wrist intraarticular distal radius fracture HPI: Pt referred from ED pt with left wrist injury Pt with comminuted displaced distal radius fracture Pt here for surgery today No prior surgery to left wrist  Past Medical History:  Diagnosis Date   Breast pain, left 10/03/2019   Hyperlipidemia    Hypertension     Past Surgical History:  Procedure Laterality Date   BREAST EXCISIONAL BIOPSY Right    BREAST EXCISIONAL BIOPSY Right    BREAST EXCISIONAL BIOPSY Left    BREAST SURGERY     early 1990's   CESAREAN SECTION     x 2   COLONOSCOPY     HEMORRHOID SURGERY     TUBAL LIGATION  1993    Family History  Problem Relation Age of Onset   Colon cancer Other    Hypertension Sister    Hypertension Mother    Social History:  reports that she has been smoking cigarettes. She has a 15.00 pack-year smoking history. She has never used smokeless tobacco. She reports current alcohol use. She reports that she does not use drugs.  Allergies: No Known Allergies  No medications prior to admission.    No results found for this or any previous visit (from the past 48 hour(s)). No results found.  ROS NO RECENT ILLNESSES OR HOSPITALIZATIONS  Height 5' 2"$  (1.575 m), weight 45.4 kg. Physical Exam  General Appearance:  Alert, cooperative, no distress, appears stated age  Head:  Normocephalic, without obvious abnormality, atraumatic  Eyes:  Pupils equal, conjunctiva/corneas clear,         Throat: Lips, mucosa, and tongue normal; teeth and gums normal  Neck: No visible masses     Lungs:   respirations unlabored  Chest Wall:  No tenderness or deformity  Heart:  Regular rate and rhythm,  Abdomen:   Soft, non-tender,         Extremities: LUE: SKIN INTACT, FINGERS WARM WELL PERFUSED ABLE TO EXTEND THUMB AND FINGERS FINGERS WARM WELL PERFUSED  Pulses: 2+ and symmetric  Skin: Skin color, texture, turgor normal, no rashes or  lesions     Neurologic: Normal     Assessment/Plan LEFT WRIST INTRAARTICULAR DISTAL RADIUS FRACTURE DISPLACED  LEFT WRIST OPEN REDUCTION AND INTERNAL FIXATION AND REPAIR AS INDICATED  R/B/A DISCUSSED WITH PT IN OFFICE.  PT VOICED UNDERSTANDING OF PLAN CONSENT SIGNED DAY OF SURGERY PT SEEN AND EXAMINED PRIOR TO OPERATIVE PROCEDURE/DAY OF SURGERY SITE MARKED. QUESTIONS ANSWERED WILL GO HOME FOLLOWING SURGERY   WE ARE PLANNING SURGERY FOR YOUR UPPER EXTREMITY. THE RISKS AND BENEFITS OF SURGERY INCLUDE BUT NOT LIMITED TO BLEEDING INFECTION, DAMAGE TO NEARBY NERVES ARTERIES TENDONS, FAILURE OF SURGERY TO ACCOMPLISH ITS INTENDED GOALS, PERSISTENT SYMPTOMS AND NEED FOR FURTHER SURGICAL INTERVENTION. WITH THIS IN MIND WE WILL PROCEED. I HAVE DISCUSSED WITH THE PATIENT THE PRE AND POSTOPERATIVE REGIMEN AND THE DOS AND DON'TS. PT VOICED UNDERSTANDING AND INFORMED CONSENT SIGNED.     Tiffany Hensley 12/08/22 @1600$ 

## 2022-12-08 ENCOUNTER — Ambulatory Visit (HOSPITAL_COMMUNITY)
Admission: RE | Admit: 2022-12-08 | Discharge: 2022-12-08 | Disposition: A | Discharge status: Home / Self Care | Payer: Self-pay | Attending: Orthopedic Surgery | Admitting: Orthopedic Surgery

## 2022-12-08 ENCOUNTER — Other Ambulatory Visit: Payer: Self-pay

## 2022-12-08 ENCOUNTER — Encounter (HOSPITAL_COMMUNITY)
Admission: RE | Disposition: A | Discharge status: Home / Self Care | Payer: Self-pay | Source: Home / Self Care | Attending: Orthopedic Surgery

## 2022-12-08 ENCOUNTER — Ambulatory Visit (HOSPITAL_BASED_OUTPATIENT_CLINIC_OR_DEPARTMENT_OTHER): Payer: Self-pay | Admitting: Anesthesiology

## 2022-12-08 ENCOUNTER — Ambulatory Visit (HOSPITAL_COMMUNITY): Payer: Self-pay

## 2022-12-08 ENCOUNTER — Ambulatory Visit (HOSPITAL_COMMUNITY): Payer: Self-pay | Admitting: Anesthesiology

## 2022-12-08 ENCOUNTER — Encounter (HOSPITAL_COMMUNITY): Payer: Self-pay | Admitting: Orthopedic Surgery

## 2022-12-08 DIAGNOSIS — I10 Essential (primary) hypertension: Secondary | ICD-10-CM

## 2022-12-08 DIAGNOSIS — F17210 Nicotine dependence, cigarettes, uncomplicated: Secondary | ICD-10-CM

## 2022-12-08 DIAGNOSIS — S52572A Other intraarticular fracture of lower end of left radius, initial encounter for closed fracture: Secondary | ICD-10-CM

## 2022-12-08 DIAGNOSIS — E785 Hyperlipidemia, unspecified: Secondary | ICD-10-CM

## 2022-12-08 HISTORY — PX: ORIF WRIST FRACTURE: SHX2133

## 2022-12-08 LAB — CBC
HCT: 40.9 % (ref 36.0–46.0)
Hemoglobin: 14.4 g/dL (ref 12.0–15.0)
MCH: 34.3 pg — ABNORMAL HIGH (ref 26.0–34.0)
MCHC: 35.2 g/dL (ref 30.0–36.0)
MCV: 97.4 fL (ref 80.0–100.0)
Platelets: 268 10*3/uL (ref 150–400)
RBC: 4.2 MIL/uL (ref 3.87–5.11)
RDW: 14.1 % (ref 11.5–15.5)
WBC: 8.9 10*3/uL (ref 4.0–10.5)
nRBC: 0 % (ref 0.0–0.2)

## 2022-12-08 LAB — BASIC METABOLIC PANEL
Anion gap: 11 (ref 5–15)
BUN: 9 mg/dL (ref 6–20)
CO2: 22 mmol/L (ref 22–32)
Calcium: 9.8 mg/dL (ref 8.9–10.3)
Chloride: 101 mmol/L (ref 98–111)
Creatinine, Ser: 0.54 mg/dL (ref 0.44–1.00)
GFR, Estimated: >60 mL/min (ref >60)
Glucose, Bld: 99 mg/dL (ref 70–99)
Potassium: 3.8 mmol/L (ref 3.5–5.1)
Sodium: 134 mmol/L — ABNORMAL LOW (ref 135–145)

## 2022-12-08 SURGERY — OPEN REDUCTION INTERNAL FIXATION (ORIF) WRIST FRACTURE
Anesthesia: Monitor Anesthesia Care | Site: Wrist | Laterality: Left

## 2022-12-08 MED ORDER — CHLORHEXIDINE GLUCONATE 0.12 % MT SOLN
15.0000 mL | Freq: Once | OROMUCOSAL | Status: AC
  Administered 2022-12-08: 15 mL via OROMUCOSAL
  Filled 2022-12-08: qty 15

## 2022-12-08 MED ORDER — MIDAZOLAM HCL 2 MG/2ML IJ SOLN
INTRAMUSCULAR | Status: AC
  Administered 2022-12-08: 2 mg
  Filled 2022-12-08: qty 2

## 2022-12-08 MED ORDER — 0.9 % SODIUM CHLORIDE (POUR BTL) OPTIME
TOPICAL | Status: DC | PRN
  Administered 2022-12-08: 1000 mL

## 2022-12-08 MED ORDER — ACETAMINOPHEN 500 MG PO TABS
1000.0000 mg | ORAL_TABLET | Freq: Once | ORAL | Status: AC
  Administered 2022-12-08: 1000 mg via ORAL
  Filled 2022-12-08: qty 2

## 2022-12-08 MED ORDER — ROPIVACAINE HCL 5 MG/ML IJ SOLN
INTRAMUSCULAR | Status: DC | PRN
  Administered 2022-12-08: 20 mL via PERINEURAL

## 2022-12-08 MED ORDER — ORAL CARE MOUTH RINSE: 15.0000 mL | Freq: Once | OROMUCOSAL | Status: AC

## 2022-12-08 MED ORDER — FENTANYL CITRATE (PF) 250 MCG/5ML IJ SOLN
INTRAMUSCULAR | Status: AC
  Filled 2022-12-08: qty 5

## 2022-12-08 MED ORDER — ONDANSETRON HCL 4 MG/2ML IJ SOLN
INTRAMUSCULAR | Status: DC | PRN
  Administered 2022-12-08: 4 mg via INTRAVENOUS

## 2022-12-08 MED ORDER — PHENYLEPHRINE HCL-NACL 20-0.9 MG/250ML-% IV SOLN
INTRAVENOUS | Status: DC | PRN
  Administered 2022-12-08: 50 ug/min via INTRAVENOUS

## 2022-12-08 MED ORDER — PHENYLEPHRINE 80 MCG/ML (10ML) SYRINGE FOR IV PUSH (FOR BLOOD PRESSURE SUPPORT)
PREFILLED_SYRINGE | INTRAVENOUS | Status: DC | PRN
  Administered 2022-12-08: 200 ug via INTRAVENOUS
  Administered 2022-12-08: 160 ug via INTRAVENOUS

## 2022-12-08 MED ORDER — FENTANYL CITRATE (PF) 100 MCG/2ML IJ SOLN
INTRAMUSCULAR | Status: AC
  Filled 2022-12-08: qty 2

## 2022-12-08 MED ORDER — PROPOFOL 500 MG/50ML IV EMUL
INTRAVENOUS | Status: DC | PRN
  Administered 2022-12-08: 100 ug/kg/min via INTRAVENOUS

## 2022-12-08 MED ORDER — DEXMEDETOMIDINE HCL IN NACL 80 MCG/20ML IV SOLN
INTRAVENOUS | Status: DC | PRN
  Administered 2022-12-08: 8 ug via BUCCAL

## 2022-12-08 MED ORDER — AMISULPRIDE (ANTIEMETIC) 5 MG/2ML IV SOLN: 10.0000 mg | Freq: Once | INTRAVENOUS | Status: DC | PRN

## 2022-12-08 MED ORDER — OXYCODONE-ACETAMINOPHEN 5-325 MG PO TABS: 1.0000 | ORAL_TABLET | ORAL | 0 refills | Status: AC | PRN

## 2022-12-08 MED ORDER — DEXAMETHASONE SODIUM PHOSPHATE 10 MG/ML IJ SOLN
INTRAMUSCULAR | Status: DC | PRN
  Administered 2022-12-08: 5 mg via INTRAVENOUS

## 2022-12-08 MED ORDER — OXYCODONE HCL 5 MG/5ML PO SOLN: 5.0000 mg | Freq: Once | ORAL | Status: DC | PRN

## 2022-12-08 MED ORDER — CEFAZOLIN SODIUM-DEXTROSE 2-4 GM/100ML-% IV SOLN
2.0000 g | INTRAVENOUS | Status: AC
  Administered 2022-12-08: 2 g via INTRAVENOUS
  Filled 2022-12-08: qty 100

## 2022-12-08 MED ORDER — LIDOCAINE 2% (20 MG/ML) 5 ML SYRINGE
INTRAMUSCULAR | Status: DC | PRN
  Administered 2022-12-08: 60 mg via INTRAVENOUS

## 2022-12-08 MED ORDER — MIDAZOLAM HCL 2 MG/2ML IJ SOLN
INTRAMUSCULAR | Status: DC | PRN
  Administered 2022-12-08: 2 mg via INTRAVENOUS

## 2022-12-08 MED ORDER — BUPIVACAINE HCL (PF) 0.25 % IJ SOLN
INTRAMUSCULAR | Status: AC
  Filled 2022-12-08: qty 30

## 2022-12-08 MED ORDER — MIDAZOLAM HCL 2 MG/2ML IJ SOLN
INTRAMUSCULAR | Status: AC
  Filled 2022-12-08: qty 2

## 2022-12-08 MED ORDER — FENTANYL CITRATE (PF) 250 MCG/5ML IJ SOLN
INTRAMUSCULAR | Status: DC | PRN
  Administered 2022-12-08 (×2): 50 ug via INTRAVENOUS

## 2022-12-08 MED ORDER — OXYCODONE HCL 5 MG PO TABS: 5.0000 mg | ORAL_TABLET | Freq: Once | ORAL | Status: DC | PRN

## 2022-12-08 MED ORDER — PROPOFOL 10 MG/ML IV BOLUS
INTRAVENOUS | Status: DC | PRN
  Administered 2022-12-08: 40 mg via INTRAVENOUS
  Administered 2022-12-08 (×2): 20 mg via INTRAVENOUS
  Administered 2022-12-08: 120 mg via INTRAVENOUS

## 2022-12-08 MED ORDER — FENTANYL CITRATE (PF) 100 MCG/2ML IJ SOLN: 25.0000 ug | INTRAMUSCULAR | Status: DC | PRN

## 2022-12-08 MED ORDER — DOCUSATE SODIUM 100 MG PO CAPS: 100.0000 mg | ORAL_CAPSULE | Freq: Two times a day (BID) | ORAL | 0 refills | Status: AC

## 2022-12-08 MED ORDER — LACTATED RINGERS IV SOLN: INTRAVENOUS | Status: DC

## 2022-12-08 SURGICAL SUPPLY — 72 items
BAG COUNTER SPONGE SURGICOUNT (BAG) ×1 IMPLANT
BIT DRILL 2.2 SS TIBIAL (BIT) IMPLANT
BLADE CLIPPER SURG (BLADE) IMPLANT
BNDG ELASTIC 3X5.8 VLCR STR LF (GAUZE/BANDAGES/DRESSINGS) ×1 IMPLANT
BNDG ELASTIC 4X5.8 VLCR STR LF (GAUZE/BANDAGES/DRESSINGS) ×1 IMPLANT
BNDG ESMARK 4X9 LF (GAUZE/BANDAGES/DRESSINGS) ×1 IMPLANT
BNDG GAUZE DERMACEA FLUFF 4 (GAUZE/BANDAGES/DRESSINGS) ×1 IMPLANT
CORD BIPOLAR FORCEPS 12FT (ELECTRODE) ×1 IMPLANT
COVER SURGICAL LIGHT HANDLE (MISCELLANEOUS) ×1 IMPLANT
CUFF TOURN SGL QUICK 18X4 (TOURNIQUET CUFF) ×1 IMPLANT
CUFF TOURN SGL QUICK 24 (TOURNIQUET CUFF)
CUFF TRNQT CYL 24X4X16.5-23 (TOURNIQUET CUFF) IMPLANT
DRAIN TLS ROUND 10FR (DRAIN) IMPLANT
DRAPE OEC MINIVIEW 54X84 (DRAPES) ×1 IMPLANT
DRAPE SURG 17X11 SM STRL (DRAPES) ×1 IMPLANT
DRSG ADAPTIC 3X8 NADH LF (GAUZE/BANDAGES/DRESSINGS) ×1 IMPLANT
DRSG EMULSION OIL 3X3 NADH (GAUZE/BANDAGES/DRESSINGS) IMPLANT
ELECT REM PT RETURN 9FT ADLT (ELECTROSURGICAL)
ELECTRODE REM PT RTRN 9FT ADLT (ELECTROSURGICAL) IMPLANT
GAUZE SPONGE 4X4 12PLY STRL (GAUZE/BANDAGES/DRESSINGS) ×1 IMPLANT
GAUZE STRETCH 2X75IN STRL (MISCELLANEOUS) IMPLANT
GLOVE BIOGEL PI IND STRL 8.5 (GLOVE) ×1 IMPLANT
GLOVE SURG ORTHO 8.0 STRL STRW (GLOVE) ×1 IMPLANT
GOWN STRL REUS W/ TWL LRG LVL3 (GOWN DISPOSABLE) ×3 IMPLANT
GOWN STRL REUS W/ TWL XL LVL3 (GOWN DISPOSABLE) ×1 IMPLANT
GOWN STRL REUS W/TWL LRG LVL3 (GOWN DISPOSABLE) ×3
GOWN STRL REUS W/TWL XL LVL3 (GOWN DISPOSABLE) ×1
K-WIRE 1.6 (WIRE) ×1
K-WIRE FX5X1.6XNS BN SS (WIRE) ×1
KIT BASIN OR (CUSTOM PROCEDURE TRAY) ×1 IMPLANT
KIT TURNOVER KIT B (KITS) ×1 IMPLANT
KWIRE FX5X1.6XNS BN SS (WIRE) IMPLANT
MANIFOLD NEPTUNE II (INSTRUMENTS) ×1 IMPLANT
NDL HYPO 25X1 1.5 SAFETY (NEEDLE) ×1 IMPLANT
NEEDLE HYPO 25X1 1.5 SAFETY (NEEDLE) ×1 IMPLANT
NS IRRIG 1000ML POUR BTL (IV SOLUTION) ×1 IMPLANT
PACK ORTHO EXTREMITY (CUSTOM PROCEDURE TRAY) ×1 IMPLANT
PAD ARMBOARD 7.5X6 YLW CONV (MISCELLANEOUS) ×2 IMPLANT
PAD CAST 3X4 CTTN HI CHSV (CAST SUPPLIES) IMPLANT
PAD CAST 4YDX4 CTTN HI CHSV (CAST SUPPLIES) ×1 IMPLANT
PADDING CAST COTTON 3X4 STRL (CAST SUPPLIES) ×2
PADDING CAST COTTON 4X4 STRL (CAST SUPPLIES) ×1
PEG LOCKING SMOOTH 2.2X20 (Screw) IMPLANT
PLATE NARROW DVR LEFT (Plate) IMPLANT
SCREW LOCK 14X2.7X 3 LD TPR (Screw) IMPLANT
SCREW LOCK 16X2.7X 3 LD TPR (Screw) IMPLANT
SCREW LOCK 18X2.7X 3 LD TPR (Screw) IMPLANT
SCREW LOCK 22X2.7X 3 LD TPR (Screw) IMPLANT
SCREW LOCKING 2.7X13MM (Screw) IMPLANT
SCREW LOCKING 2.7X14 (Screw) ×2 IMPLANT
SCREW LOCKING 2.7X16 (Screw) ×1 IMPLANT
SCREW LOCKING 2.7X18 (Screw) ×1 IMPLANT
SCREW LOCKING 2.7X22MM (Screw) ×1 IMPLANT
SCREW MULTI DIRECTIONAL 2.7X16 (Screw) IMPLANT
SCREW MULTI DIRECTIONAL 2.7X18 (Screw) IMPLANT
SCREW MULTI DIRECTIONAL 2.7X20 (Screw) IMPLANT
SCREW MULTI DIRECTIONAL 2.7X22 (Screw) IMPLANT
SCREW NLOCK 24X2.7 3 LD (Screw) IMPLANT
SCREW NONLOCK 2.7X24 (Screw) ×1 IMPLANT
SOAP 2 % CHG 4 OZ (WOUND CARE) ×1 IMPLANT
SUT MNCRL AB 4-0 PS2 18 (SUTURE) IMPLANT
SUT PROLENE 4 0 PS 2 18 (SUTURE) IMPLANT
SUT VIC AB 2-0 CT1 27 (SUTURE) ×1
SUT VIC AB 2-0 CT1 TAPERPNT 27 (SUTURE) IMPLANT
SUT VIC AB 2-0 FS1 27 (SUTURE) IMPLANT
SUT VICRYL 4-0 PS2 18IN ABS (SUTURE) IMPLANT
SYR CONTROL 10ML LL (SYRINGE) IMPLANT
SYSTEM CHEST DRAIN TLS 7FR (DRAIN) IMPLANT
TOWEL GREEN STERILE (TOWEL DISPOSABLE) ×1 IMPLANT
TOWEL GREEN STERILE FF (TOWEL DISPOSABLE) ×1 IMPLANT
TUBE CONNECTING 12X1/4 (SUCTIONS) ×1 IMPLANT
WATER STERILE IRR 1000ML POUR (IV SOLUTION) ×1 IMPLANT

## 2022-12-08 NOTE — Progress Notes (Signed)
Orthopedic Tech Progress Note Patient Details:  Mischele [place] [facility] 1965-10-27 [id number]  Ortho Devices Type of Ortho Device: Sling immobilizer Ortho Device/Splint Interventions: Ordered     Sling dropped off at bedside with PACU RN. Vernona Rieger 12/08/2022, 6:06 PM

## 2022-12-08 NOTE — Anesthesia Preprocedure Evaluation (Addendum)
Anesthesia Evaluation  Patient identified by MRN, date of birth, ID band Patient awake    Reviewed: Allergy & Precautions, NPO status , Patient's Chart, lab work & pertinent test results  History of Anesthesia Complications Negative for: history of anesthetic complications  Airway Mallampati: II  TM Distance: >3 FB Neck ROM: Full    Dental  (+) Dental Advisory Given Missing several teeth. Denies loose teeth.:   Pulmonary neg shortness of breath, neg sleep apnea, neg COPD, neg recent URI, Current Smoker   Pulmonary exam normal breath sounds clear to auscultation       Cardiovascular hypertension (lisinopril), Pt. on medications (-) angina (-) Past MI, (-) Cardiac Stents and (-) CABG (-) dysrhythmias  Rhythm:Regular Rate:Normal  HLD   Neuro/Psych negative neurological ROS     GI/Hepatic negative GI ROS, Neg liver ROS,,,  Endo/Other  negative endocrine ROS    Renal/GU negative Renal ROS     Musculoskeletal   Abdominal   Peds  Hematology negative hematology ROS (+)   Anesthesia Other Findings   Reproductive/Obstetrics                             Anesthesia Physical Anesthesia Plan  ASA: 2  Anesthesia Plan: Regional and MAC   Post-op Pain Management: Regional block* and Tylenol PO (pre-op)*   Induction: Intravenous  PONV Risk Score and Plan: 1 and Ondansetron, Dexamethasone, Propofol infusion and Treatment may vary due to age or medical condition  Airway Management Planned: Natural Airway and Simple Face Mask  Additional Equipment:   Intra-op Plan:   Post-operative Plan:   Informed Consent: I have reviewed the patients History and Physical, chart, labs and discussed the procedure including the risks, benefits and alternatives for the proposed anesthesia with the patient or authorized representative who has indicated his/her understanding and acceptance.     Dental advisory  given  Plan Discussed with: CRNA and Anesthesiologist  Anesthesia Plan Comments: (Discussed potential risks of nerve blocks including, but not limited to, infection, bleeding, nerve damage, seizures, pneumothorax, respiratory depression, and potential failure of the block. Alternatives to nerve blocks discussed. All questions answered.  Risks of general anesthesia discussed including, but not limited to, sore throat, hoarse voice, chipped/damaged teeth, injury to vocal cords, nausea and vomiting, allergic reactions, lung infection, heart attack, stroke, and death. All questions answered. )        Anesthesia Quick Evaluation

## 2022-12-08 NOTE — Transfer of Care (Signed)
Immediate Anesthesia Transfer of Care Note  Patient: Tiffany Hensley  Procedure(s) Performed: Left wrist distal radius fracture open reduction internal fixation and repair (Left: Wrist)  Patient Location: PACU  Anesthesia Type:General and Regional  Level of Consciousness: awake, alert , and oriented  Airway & Oxygen Therapy: Patient Spontanous Breathing  Post-op Assessment: Report given to RN, Post -op Vital signs reviewed and stable, and Patient moving all extremities X 4  Post vital signs: Reviewed and stable  Last Vitals:  Vitals Value Taken Time  BP    Temp    Pulse    Resp    SpO2      Last Pain:  Vitals:   12/08/22 1327  TempSrc:   PainSc: 4          Complications: No notable events documented.

## 2022-12-08 NOTE — Op Note (Signed)
PREOPERATIVE DIAGNOSIS: Left wrist intra-articular distal radius fracture 3 more fragments   POSTOPERATIVE DIAGNOSIS: Same   ATTENDING SURGEON: Dr. Iran Planas who scrubbed and present for the entire procedure   ASSISTANT SURGEON: None   ANESTHESIA: Regional with IV sedation   OPERATIVE PROCEDURE: Open reduction internal fixation displaced left wrist intra-articular distal radius fracture 3 more fragments Left brachioradialis tendon tenotomy tendon release Radiographs 3 views left wrist   IMPLANTS: [employer] DVR cross lock narrow   EBL: Minimal   RADIOGRAPHIC INTERPRETATION: AP lateral oblique views of the wrist do show the volar plate fixation in place with good position with good restoration of radial height inclination and tilt   SURGICAL INDICATIONS: Patient is a right-hand-dominant female who is referred to the office from the ER after sustaining a fall on an outstretched right wrist rollerskating.  Patient was seen and evaluated the office and recommend undergo the above procedure.  Risks of surgery include but not limited to bleeding infection damage nearby nerves arteries or tendons loss of motion of the wrist and digits incomplete relief of symptoms and need for further surgical invention.  Signed informed consent was obtained on the day of surgery.   SURGICAL TECHNIQUE: The patient was prepped identified in the preoperative holding area marked apart a marker made on the left wrist indicate correct operative site.  The patient brought back to operating placed supine on the anesthesia table where the regional anesthetic was administered.  Patient tolerated this well.  A well-padded tourniquet placed on the left brachium and sealed with the appropriate drape.  The right upper extremities then prepped and draped normal sterile fashion.  Timeout was called the correct site identified procedure then began.  Attention then turned to the right wrist.  Longitude incision made directly over the  FCR sheath.  Preoperative antibiotics were given prior to skin incision.  Dissection carried down through the skin and subcutaneous tissue.  The FCR sheath was opened proximally distally.  Going through the floor the FCR sheath the FPL was then carefully identified and protected.  The pronator quadratus was elevated in an L-shaped fashion and the fracture site was then exposed.  In order to reduce the radial column separate intervention was then undertaken to release the brachial radialis off the distal radial styloid.  Tendon tenotomy was then carried out carefully protecting the first dorsal compartment tendons.  Once this was undertaken open reduction was then performed.  This was an intra-articular fracture 3 more fragments.  Open reduction was then performed and the volar plate was then applied.  It was held distally with a K wire.  Plate position was then confirmed.  The oblong screw hole was then placed proximally.  Plate height was appropriately adjusted.  Following this distal fixation was then carried out from an ulnar to radial direction with the distal multidirectional.  Following this final shaft fixation was then carried out with combination of locking nonlocking screws within the radial shaft.  The wound was then thoroughly irrigated.  Final radiographs were then obtained.  The pronator quadratus was then closed with 2-0 Vicryl.  Subcutaneous tissues closed with 4-0 Monocryl and skin closed with simple Prolene suture.  Adaptic dressing sterile compressive bandage then applied.  The patient was then placed in a well-padded sugar-tong splint taken recovery in good condition.   POSTOPERATIVE PLAN: Patient be discharged to home.  See her back in the office in approximately 2 weeks for wound check suture removal transition of short arm cast for  2 weeks then begin an outpatient therapy regimen the 4-week mark.  Radiographs at each visit

## 2022-12-08 NOTE — Anesthesia Postprocedure Evaluation (Signed)
Anesthesia Post Note  Patient: Tiffany Hensley  Procedure(s) Performed: Left wrist distal radius fracture open reduction internal fixation and repair (Left: Wrist)     Patient location during evaluation: PACU Anesthesia Type: General Level of consciousness: awake and alert and oriented Pain management: pain level controlled Vital Signs Assessment: post-procedure vital signs reviewed and stable Respiratory status: spontaneous breathing, nonlabored ventilation and respiratory function stable Cardiovascular status: blood pressure returned to baseline and stable Postop Assessment: no apparent nausea or vomiting Anesthetic complications: no   No notable events documented.  Last Vitals:  Vitals:   12/08/22 1721 12/08/22 1745  BP: 119/61 125/69  Pulse: 70 (!) 57  Resp: 15 15  Temp: 36.9 C   SpO2: 98% 98%    Last Pain:  Vitals:   12/08/22 1745  TempSrc:   PainSc: Asleep                 Chan Sheahan A.

## 2022-12-08 NOTE — Discharge Instructions (Signed)
KEEP BANDAGE CLEAN AND DRY °CALL OFFICE FOR F/U APPT [id number] in 14 days °KEEP HAND ELEVATED ABOVE HEART °OK TO APPLY ICE TO OPERATIVE AREA °CONTACT OFFICE IF ANY WORSENING PAIN OR CONCERNS. °

## 2022-12-08 NOTE — Anesthesia Procedure Notes (Addendum)
Anesthesia Regional Block: Supraclavicular block   Pre-Anesthetic Checklist: , timeout performed,  Correct Patient, Correct Site, Correct Laterality,  Correct Procedure, Correct Position, site marked,  Risks and benefits discussed,  Surgical consent,  Pre-op evaluation,  At surgeon's request and post-op pain management  Laterality: Left  Prep: chloraprep       Needles:  Injection technique: Single-shot  Needle Type: Echogenic Stimulator Needle     Needle Length: 9cm  Needle Gauge: 21     Additional Needles:   Procedures:,,,, ultrasound used (permanent image in chart),,    Narrative:  Start time: 12/08/2022 1:49 PM End time: 12/08/2022 1:52 PM Injection made incrementally with aspirations every 5 mL.  Performed by: Personally  Anesthesiologist: Nilda Simmer, MD  Additional Notes: Patient able to wiggle her fingers, but movement limited, particularly giving a thumbs up.  Discussed risks and benefits of nerve block including, but not limited to, prolonged and/or permanent nerve injury involving sensory and/or motor function. Monitors were applied and a time-out was performed. The nerve and associated structures were visualized under ultrasound guidance. After negative aspiration, local anesthetic was slowly injected around the nerve. There was no evidence of high pressure during the procedure. There were no paresthesias. VSS remained stable and the patient tolerated the procedure well.

## 2022-12-08 NOTE — Anesthesia Procedure Notes (Signed)
Procedure Name: LMA Insertion Date/Time: 12/08/2022 4:17 PM  Performed by: Rande Brunt, CRNAPre-anesthesia Checklist: Patient identified, Patient being monitored, Timeout performed, Emergency Drugs available and Suction available Patient Re-evaluated:Patient Re-evaluated prior to induction Oxygen Delivery Method: Circle system utilized Preoxygenation: Pre-oxygenation with 100% oxygen Induction Type: IV induction Ventilation: Mask ventilation without difficulty LMA: LMA inserted LMA Size: 3.0 Tube type: Oral Number of attempts: 1 Placement Confirmation: positive ETCO2 and breath sounds checked- equal and bilateral Tube secured with: Tape Dental Injury: Teeth and Oropharynx as per pre-operative assessment

## 2022-12-09 ENCOUNTER — Encounter (HOSPITAL_COMMUNITY): Payer: Self-pay | Admitting: Orthopedic Surgery
